# Patient Record
Sex: Male | Born: 1985
Health system: Southern US, Community
[De-identification: ages and names within clinical notes are randomized; demographics above are authoritative.]

---
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
## 2010-08-10 ENCOUNTER — Inpatient Hospital Stay: Payer: Self-pay | Admitting: Unknown Physician Specialty

## 2012-01-31 ENCOUNTER — Ambulatory Visit: Payer: Self-pay | Admitting: Physician Assistant

## 2013-08-08 ENCOUNTER — Emergency Department: Payer: Self-pay | Admitting: Emergency Medicine

## 2013-08-08 LAB — URINALYSIS, COMPLETE
BILIRUBIN, UR: NEGATIVE
BLOOD: NEGATIVE
Bacteria: NONE SEEN
Glucose,UR: NEGATIVE mg/dL (ref 0–75)
Ketone: NEGATIVE
Leukocyte Esterase: NEGATIVE
NITRITE: NEGATIVE
PH: 5 (ref 4.5–8.0)
Protein: NEGATIVE
RBC,UR: 14 /HPF (ref 0–5)
SPECIFIC GRAVITY: 1.023 (ref 1.003–1.030)
Squamous Epithelial: <1

## 2013-08-08 LAB — COMPREHENSIVE METABOLIC PANEL
ALK PHOS: 88 U/L
ANION GAP: 6 — AB (ref 7–16)
Albumin: 4.6 g/dL (ref 3.4–5.0)
BUN: 9 mg/dL (ref 7–18)
Bilirubin,Total: 0.9 mg/dL (ref 0.2–1.0)
CALCIUM: 9.1 mg/dL (ref 8.5–10.1)
Chloride: 107 mmol/L (ref 98–107)
Co2: 27 mmol/L (ref 21–32)
Creatinine: 1.22 mg/dL (ref 0.60–1.30)
EGFR (African American): >60
GLUCOSE: 87 mg/dL (ref 65–99)
OSMOLALITY: 277 (ref 275–301)
Potassium: 3.5 mmol/L (ref 3.5–5.1)
SGOT(AST): 19 U/L (ref 15–37)
SGPT (ALT): 29 U/L (ref 12–78)
Sodium: 140 mmol/L (ref 136–145)
Total Protein: 8.8 g/dL — ABNORMAL HIGH (ref 6.4–8.2)

## 2013-08-08 LAB — CBC
HCT: 46.2 % (ref 40.0–52.0)
HGB: 15.6 g/dL (ref 13.0–18.0)
MCH: 29.8 pg (ref 26.0–34.0)
MCHC: 33.8 g/dL (ref 32.0–36.0)
MCV: 88 fL (ref 80–100)
PLATELETS: 277 10*3/uL (ref 150–440)
RBC: 5.24 10*6/uL (ref 4.40–5.90)
RDW: 12.5 % (ref 11.5–14.5)
WBC: 8 10*3/uL (ref 3.8–10.6)

## 2013-09-12 ENCOUNTER — Emergency Department: Payer: Self-pay | Admitting: Emergency Medicine

## 2013-09-12 LAB — BASIC METABOLIC PANEL
Anion Gap: 10 (ref 7–16)
BUN: 7 mg/dL (ref 7–18)
CALCIUM: 8.8 mg/dL (ref 8.5–10.1)
CO2: 24 mmol/L (ref 21–32)
CREATININE: 1.26 mg/dL (ref 0.60–1.30)
Chloride: 106 mmol/L (ref 98–107)
EGFR (Non-African Amer.): >60
Glucose: 123 mg/dL — ABNORMAL HIGH (ref 65–99)
Osmolality: 279 (ref 275–301)
Potassium: 3.3 mmol/L — ABNORMAL LOW (ref 3.5–5.1)
SODIUM: 140 mmol/L (ref 136–145)

## 2013-09-12 LAB — CBC
HCT: 42.9 % (ref 40.0–52.0)
HGB: 14.4 g/dL (ref 13.0–18.0)
MCH: 29.7 pg (ref 26.0–34.0)
MCHC: 33.5 g/dL (ref 32.0–36.0)
MCV: 89 fL (ref 80–100)
Platelet: 278 10*3/uL (ref 150–440)
RBC: 4.84 10*6/uL (ref 4.40–5.90)
RDW: 13.1 % (ref 11.5–14.5)
WBC: 9.6 10*3/uL (ref 3.8–10.6)

## 2013-09-12 LAB — TROPONIN I

## 2017-11-09 ENCOUNTER — Other Ambulatory Visit: Payer: Self-pay

## 2017-11-09 ENCOUNTER — Emergency Department
Admission: EM | Admit: 2017-11-09 | Discharge: 2017-11-10 | Disposition: A | Discharge status: Home / Self Care | Payer: Self-pay | Attending: Emergency Medicine | Admitting: Emergency Medicine

## 2017-11-09 DIAGNOSIS — F10120 Alcohol abuse with intoxication, uncomplicated: Secondary | ICD-10-CM | POA: Insufficient documentation

## 2017-11-09 DIAGNOSIS — F321 Major depressive disorder, single episode, moderate: Secondary | ICD-10-CM

## 2017-11-09 DIAGNOSIS — F410 Panic disorder [episodic paroxysmal anxiety] without agoraphobia: Secondary | ICD-10-CM

## 2017-11-09 DIAGNOSIS — F1994 Other psychoactive substance use, unspecified with psychoactive substance-induced mood disorder: Secondary | ICD-10-CM | POA: Insufficient documentation

## 2017-11-09 DIAGNOSIS — F329 Major depressive disorder, single episode, unspecified: Secondary | ICD-10-CM | POA: Insufficient documentation

## 2017-11-09 DIAGNOSIS — F1010 Alcohol abuse, uncomplicated: Secondary | ICD-10-CM

## 2017-11-09 LAB — CBC
HEMATOCRIT: 39.4 % — AB (ref 40.0–52.0)
Hemoglobin: 14.2 g/dL (ref 13.0–18.0)
MCH: 31.3 pg (ref 26.0–34.0)
MCHC: 36 g/dL (ref 32.0–36.0)
MCV: 86.8 fL (ref 80.0–100.0)
Platelets: 230 10*3/uL (ref 150–440)
RBC: 4.54 MIL/uL (ref 4.40–5.90)
RDW: 14.1 % (ref 11.5–14.5)
WBC: 7.3 10*3/uL (ref 3.8–10.6)

## 2017-11-09 LAB — COMPREHENSIVE METABOLIC PANEL
ALT: 18 U/L (ref 0–44)
AST: 22 U/L (ref 15–41)
Albumin: 3.7 g/dL (ref 3.5–5.0)
Alkaline Phosphatase: 82 U/L (ref 38–126)
Anion gap: 12 (ref 5–15)
BUN: 7 mg/dL (ref 6–20)
CHLORIDE: 108 mmol/L (ref 98–111)
CO2: 21 mmol/L — ABNORMAL LOW (ref 22–32)
Calcium: 8.3 mg/dL — ABNORMAL LOW (ref 8.9–10.3)
Creatinine, Ser: 0.97 mg/dL (ref 0.61–1.24)
GFR calc Af Amer: >60 mL/min (ref >60)
GLUCOSE: 141 mg/dL — AB (ref 70–99)
Potassium: 3 mmol/L — ABNORMAL LOW (ref 3.5–5.1)
Sodium: 141 mmol/L (ref 135–145)
TOTAL PROTEIN: 7.1 g/dL (ref 6.5–8.1)
Total Bilirubin: 1.2 mg/dL (ref 0.3–1.2)

## 2017-11-09 LAB — ETHANOL: ALCOHOL ETHYL (B): 85 mg/dL — AB (ref <10)

## 2017-11-09 NOTE — ED Notes (Addendum)
Pt. Transferred to BHU from ED to room 2 after screening for contraband. Report to include Situation, Background, Assessment and Recommendations from [facility] Pt. Oriented to unit including Q15 minute rounds as well as the security cameras for their protection. Patient is alert and oriented, warm and dry in no acute distress. Patient denies SI, HI, and AVH. Pt. Encouraged to let me know if needs arise.

## 2017-11-09 NOTE — ED Notes (Signed)
Pt dressed out by this tech and [place], EDT. Pts belongings include: black sneakers, socks, black shorts, underwear and a t shirt. Pt was unable to get tongue ring out. Will attempt to remove again in a few minutes. Pts belongings placed in belongings bag and labeled.  After dressing out pt stated, "I just want to go home."

## 2017-11-09 NOTE — ED Notes (Signed)
Pt extremely drowsy, pt asked to provide urine sample if possible, unable to provide one at this time

## 2017-11-09 NOTE — ED Provider Notes (Signed)
[facility] Emergency Department Provider Note  ____________________________________________   I have reviewed the triage vital signs and the nursing notes.   HISTORY  Chief Complaint Alcohol Intoxication   History limited by and level 5 caveat due to: Intoxication   HPI Edward Peters is a 32 y.o. male who presents to the emergency department today brought in by EMS because of concerns for alcohol intoxication.  Patient states that he drank an unknown amount of liquor today.  It is unclear who called 911.  Patient denies that he himself called 911.  He does state that he has been drinking a lot recently to try to ease the pain of being separated from his children.  He denies any thoughts of self-harm.  He denies any desire to stop drinking.    History reviewed. No pertinent past medical history.  There are no active problems to display for this patient.   History reviewed. No pertinent surgical history.  Prior to Admission medications   Not on File    Allergies Patient has no allergy information on record.  History reviewed. No pertinent family history.  Social History Social History   Tobacco Use  . Smoking status: Never Smoker  . Smokeless tobacco: Current User  Substance Use Topics  . Alcohol use: Yes  . Drug use: Never    Review of Systems Constitutional: No fever/chills Eyes: No visual changes. ENT: No sore throat. Cardiovascular: Denies chest pain. Respiratory: Denies shortness of breath. Gastrointestinal: No abdominal pain.  No nausea, no vomiting.  No diarrhea.   Genitourinary: Negative for dysuria. Musculoskeletal: Negative for back pain. Skin: Negative for rash. Neurological: Negative for headaches, focal weakness or numbness.  ____________________________________________   PHYSICAL EXAM:  VITAL SIGNS: ED Triage Vitals [11/09/17 1514]  Enc Vitals Group     BP 125/79     Pulse Rate 91     Resp 18     Temp    Temp src      SpO2 97 %     Weight 170 lb (77.1 kg)     Height 6' (1.829 m)     Head Circumference      Peak Flow      Pain Score 0   Constitutional: Intoxicated. Eyes: Conjunctivae are normal.  ENT      Head: Normocephalic and atraumatic.      Nose: No congestion/rhinnorhea.      Mouth/Throat: Mucous membranes are moist.      Neck: No stridor. Hematological/Lymphatic/Immunilogical: No cervical lymphadenopathy. Cardiovascular: Normal rate, regular rhythm.  No murmurs, rubs, or gallops.  Respiratory: Normal respiratory effort without tachypnea nor retractions. Breath sounds are clear and equal bilaterally. No wheezes/rales/rhonchi. Gastrointestinal: Soft and non tender. No rebound. No guarding.  Genitourinary: Deferred Musculoskeletal: Normal range of motion in all extremities. No lower extremity edema. Neurologic:  Intoxicated Skin:  Skin is warm, dry and intact. No rash noted. Psychiatric: Depressed, intoxicated ____________________________________________    LABS (pertinent positives/negatives)  Ethanol 85 CBC wbc 7.3, hgb 14.2, plt 230 CMP na 141, k 3.0, glu 141, cr 0.97  ____________________________________________   EKG  I, Phineas Semen, attending physician, personally viewed and interpreted this EKG  EKG Time: 1507 Rate: 97 Rhythm: normal sinus rhythm Axis: normal Intervals: qtc 477 QRS: narrow ST changes: no st elevation Impression: normal ekg  ____________________________________________    RADIOLOGY  None  ____________________________________________   PROCEDURES  Procedures  ____________________________________________   INITIAL IMPRESSION / ASSESSMENT AND PLAN / ED COURSE  Pertinent  labs & imaging results that were available during my care of the patient were reviewed by me and considered in my medical decision making (see chart for details).   Patient presented to the emergency department today because of concerns for alcohol  intoxication.  He states he did drink today.  Discussed with the father who also had some pills that he took.  It appears that these pills were Atarax.  On exam patient was drowsy but arousable.  He was observed in the emergency department and did slowly regain more sobriety.  Patient was placed under IVC by family.  Do have some concerns for suicidal ideation.  Will have patient be seen by psychiatry.   ____________________________________________   FINAL CLINICAL IMPRESSION(S) / ED DIAGNOSES  Alcohol use Overdose Depression  Note: This dictation was prepared with Dragon dictation. Any transcriptional errors that result from this process are unintentional     Phineas Semen, MD 11/09/17 2333

## 2017-11-09 NOTE — ED Notes (Signed)
Father states he believes pt took several hydralazine pills. Father concerned for pt safety, states pt has been drinking non stop for 4 days. Father states he feels pt is depressed and need psych evaluation. Pt denies any suicidal ideations or HI at this time. Pt upset that he is being kept in the hospital, and states he wants to leave. Pt more alert and sitting up in bed at this time. Pt gave verbal permission to give father password to call regarding information about the pt.

## 2017-11-09 NOTE — ED Notes (Signed)
Pts tongue ring added to belongings bag.

## 2017-11-09 NOTE — ED Notes (Signed)
Pt notified of IVC status at this time.

## 2017-11-09 NOTE — ED Triage Notes (Signed)
Pt arrives via ems from home. Ems states pt brought in for alcohol intoxication. Pt alert, but drowsy on arrival, pt unable to states entire birthdate at this time. Only able to state month of birthday. Ems says pt has hx of depression, problems with ex girlfriend who currently has custody of his kids. VS WNL at this time. Pt states he drank 4-5 fifths of liquor, gen/jack/rum. Pt speech slurred but able to follow instructions and communicate clearly. Denies any thoughts of self harm, or harm to others

## 2017-11-09 NOTE — ED Notes (Signed)
Hourly rounding reveals patient sleeping in room. No complaints, stable, in no acute distress. Q15 minute rounds and monitoring via Security Cameras to continue. 

## 2017-11-10 DIAGNOSIS — F1994 Other psychoactive substance use, unspecified with psychoactive substance-induced mood disorder: Secondary | ICD-10-CM

## 2017-11-10 DIAGNOSIS — F410 Panic disorder [episodic paroxysmal anxiety] without agoraphobia: Secondary | ICD-10-CM

## 2017-11-10 DIAGNOSIS — F1010 Alcohol abuse, uncomplicated: Secondary | ICD-10-CM

## 2017-11-10 DIAGNOSIS — F321 Major depressive disorder, single episode, moderate: Secondary | ICD-10-CM

## 2017-11-10 MED ORDER — PAROXETINE HCL 20 MG PO TABS: 20.0000 mg | ORAL_TABLET | Freq: Every day | ORAL | 1 refills | Status: DC

## 2017-11-10 NOTE — ED Notes (Signed)
Patient talking to Dr. Clapacs 

## 2017-11-10 NOTE — ED Notes (Signed)
Hourly rounding reveals patient sleeping in room. No complaints, stable, in no acute distress. Q15 minute rounds and monitoring via Security Cameras to continue. 

## 2017-11-10 NOTE — ED Notes (Signed)
Hourly rounding reveals patient in room. No complaints, stable, in no acute distress. Q15 minute rounds and monitoring via Security Cameras to continue. 

## 2017-11-10 NOTE — ED Notes (Signed)
Patient discharged home with mother, patient received discharge papers and prescription for Paxil. Patient received belongings and verbalized he has received all of his belongings. Patient appropriate and cooperative, Denies SI/HI AVH. Vital signs taken. NAD noted. Denies pain

## 2017-11-10 NOTE — BH Assessment (Signed)
TTS Consult wasn't completed. Patient was seen by Psych MD (Dr. Toni Amend) and discharged prior to writer seeing the patient.

## 2017-11-10 NOTE — ED Provider Notes (Signed)
Commitment was overturned by the tele-psychiatrist.  He is cleared for outpatient follow-up.   Emily Filbert, MD 11/10/17 [phone]
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
## 2020-04-02 ENCOUNTER — Emergency Department
Admission: EM | Admit: 2020-04-02 | Discharge: 2020-04-03 | Disposition: A | Discharge status: Other Acute Inpatient Hospital | Payer: Self-pay | Attending: Emergency Medicine | Admitting: Emergency Medicine

## 2020-04-02 ENCOUNTER — Other Ambulatory Visit: Payer: Self-pay

## 2020-04-02 ENCOUNTER — Emergency Department: Payer: Self-pay

## 2020-04-02 DIAGNOSIS — Z046 Encounter for general psychiatric examination, requested by authority: Secondary | ICD-10-CM | POA: Insufficient documentation

## 2020-04-02 DIAGNOSIS — R42 Dizziness and giddiness: Secondary | ICD-10-CM | POA: Insufficient documentation

## 2020-04-02 DIAGNOSIS — X838XXA Intentional self-harm by other specified means, initial encounter: Secondary | ICD-10-CM | POA: Insufficient documentation

## 2020-04-02 DIAGNOSIS — T71162A Asphyxiation due to hanging, intentional self-harm, initial encounter: Secondary | ICD-10-CM

## 2020-04-02 DIAGNOSIS — T1491XA Suicide attempt, initial encounter: Secondary | ICD-10-CM | POA: Insufficient documentation

## 2020-04-02 DIAGNOSIS — Z20822 Contact with and (suspected) exposure to covid-19: Secondary | ICD-10-CM | POA: Insufficient documentation

## 2020-04-02 DIAGNOSIS — F332 Major depressive disorder, recurrent severe without psychotic features: Secondary | ICD-10-CM

## 2020-04-02 DIAGNOSIS — F321 Major depressive disorder, single episode, moderate: Secondary | ICD-10-CM | POA: Insufficient documentation

## 2020-04-02 DIAGNOSIS — T71164A Asphyxiation due to hanging, undetermined, initial encounter: Secondary | ICD-10-CM

## 2020-04-02 DIAGNOSIS — F17200 Nicotine dependence, unspecified, uncomplicated: Secondary | ICD-10-CM | POA: Insufficient documentation

## 2020-04-02 LAB — COMPREHENSIVE METABOLIC PANEL
ALT: 24 U/L (ref 0–44)
AST: 30 U/L (ref 15–41)
Albumin: 4.7 g/dL (ref 3.5–5.0)
Alkaline Phosphatase: 77 U/L (ref 38–126)
Anion gap: 13 (ref 5–15)
BUN: 12 mg/dL (ref 6–20)
CO2: 25 mmol/L (ref 22–32)
Calcium: 9.4 mg/dL (ref 8.9–10.3)
Chloride: 96 mmol/L — ABNORMAL LOW (ref 98–111)
Creatinine, Ser: 1.24 mg/dL (ref 0.61–1.24)
GFR, Estimated: >60 mL/min (ref >60)
Glucose, Bld: 101 mg/dL — ABNORMAL HIGH (ref 70–99)
Potassium: 3.7 mmol/L (ref 3.5–5.1)
Sodium: 134 mmol/L — ABNORMAL LOW (ref 135–145)
Total Bilirubin: 2.7 mg/dL — ABNORMAL HIGH (ref 0.3–1.2)
Total Protein: 8.2 g/dL — ABNORMAL HIGH (ref 6.5–8.1)

## 2020-04-02 LAB — CBC WITH DIFFERENTIAL/PLATELET
Abs Immature Granulocytes: 0.03 10*3/uL (ref 0.00–0.07)
Basophils Absolute: 0 10*3/uL (ref 0.0–0.1)
Basophils Relative: 0 %
Eosinophils Absolute: 0.2 10*3/uL (ref 0.0–0.5)
Eosinophils Relative: 3 %
HCT: 42.5 % (ref 39.0–52.0)
Hemoglobin: 15.4 g/dL (ref 13.0–17.0)
Immature Granulocytes: 0 %
Lymphocytes Relative: 25 %
Lymphs Abs: 1.9 10*3/uL (ref 0.7–4.0)
MCH: 31.2 pg (ref 26.0–34.0)
MCHC: 36.2 g/dL — ABNORMAL HIGH (ref 30.0–36.0)
MCV: 86.2 fL (ref 80.0–100.0)
Monocytes Absolute: 0.6 10*3/uL (ref 0.1–1.0)
Monocytes Relative: 8 %
Neutro Abs: 5 10*3/uL (ref 1.7–7.7)
Neutrophils Relative %: 64 %
Platelets: 283 10*3/uL (ref 150–400)
RBC: 4.93 MIL/uL (ref 4.22–5.81)
RDW: 11.8 % (ref 11.5–15.5)
WBC: 7.9 10*3/uL (ref 4.0–10.5)
nRBC: 0 % (ref 0.0–0.2)

## 2020-04-02 LAB — APTT: aPTT: 27 seconds (ref 24–36)

## 2020-04-02 LAB — SALICYLATE LEVEL: Salicylate Lvl: <7 mg/dL — ABNORMAL LOW (ref 7.0–30.0)

## 2020-04-02 LAB — LACTIC ACID, PLASMA
Lactic Acid, Venous: 2.1 mmol/L (ref 0.5–1.9)
Lactic Acid, Venous: 1 mmol/L (ref 0.5–1.9)

## 2020-04-02 LAB — RESP PANEL BY RT-PCR (FLU A&B, COVID) ARPGX2
Influenza A by PCR: NEGATIVE
Influenza B by PCR: NEGATIVE
SARS Coronavirus 2 by RT PCR: NEGATIVE

## 2020-04-02 LAB — PROTIME-INR
INR: 1 (ref 0.8–1.2)
Prothrombin Time: 12.8 seconds (ref 11.4–15.2)

## 2020-04-02 LAB — ETHANOL: Alcohol, Ethyl (B): <10 mg/dL (ref <10)

## 2020-04-02 LAB — ACETAMINOPHEN LEVEL: Acetaminophen (Tylenol), Serum: <10 ug/mL — ABNORMAL LOW (ref 10–30)

## 2020-04-02 MED ORDER — SODIUM CHLORIDE 0.9 % IV BOLUS
500.0000 mL | Freq: Once | INTRAVENOUS | Status: AC
  Administered 2020-04-02: 500 mL via INTRAVENOUS

## 2020-04-02 MED ORDER — IOHEXOL 350 MG/ML SOLN
75.0000 mL | Freq: Once | INTRAVENOUS | Status: AC | PRN
  Administered 2020-04-02: 75 mL via INTRAVENOUS

## 2020-04-02 MED ORDER — TETANUS-DIPHTH-ACELL PERTUSSIS 5-2.5-18.5 LF-MCG/0.5 IM SUSY
0.5000 mL | PREFILLED_SYRINGE | Freq: Once | INTRAMUSCULAR | Status: DC
  Filled 2020-04-02: qty 0.5

## 2020-04-02 NOTE — ED Notes (Signed)
Unable to get vitals at this time. Pt sleeping.

## 2020-04-02 NOTE — ED Notes (Signed)
IVC  SEEN  BY  DR  CLAPACS  PENDING  PLACEMENT 

## 2020-04-02 NOTE — BH Assessment (Signed)
Comprehensive Clinical Assessment (CCA) Note  04/02/2020 Delray AltRobert G Peters [id number]  Edward GarreRobert Peters is a 35 year old, Caucasian male who presents to the ER after attempting to end his life. Information provided to the ER was that the patient had hung his self in a tree with the intentions of dying. Patient became upset because he was cut down from the tree and unable to complete the suicide. With this writer the patient was tearful guarded and withdrawn. He stated there was no point in talking to him because he has no reason to live. He reported he have had a lot of loss. Approximately a month ago he and his wife separated, and she took the three children, as well as he lost his job.  During the interview patient was cooperative and made attempts to engage with the Clinical research associatewriter. However, he was adamant about the conversation being pointless because he had no hope or reason to live. Is unclear if this was the patient only suicide attempt. When asked, patient became more tearful, and it was difficult for writer to understand what he was saying   Chief Complaint:  Chief Complaint  Patient presents with  . Suicide Attempt   Visit Diagnosis: Major Depression    CCA Screening, Triage and Referral (STR)  Patient Reported Information How did you hear about us? No data recorded Referral name: EMS  Referral phone number: No data recorded  Whom do you see for routine medical problems? I don't have a doctor  Practice/Facility Name: No data recorded Practice/Facility Phone Number: No data recorded Name of Contact: No data recorded Contact Number: No data recorded Contact Fax Number: No data recorded Prescriber Name: No data recorded Prescriber Address (if known): No data recorded  What Is the Reason for Your Visit/Call Today? Suicide Attempt  How Long Has This Been Causing You Problems? 1-6 months  What Do You Feel Would Help You the Most Today? No data recorded  Have You Recently Been in Any  Inpatient Treatment (Hospital/Detox/Crisis Center/28-Day Program)? No  Name/Location of Program/Hospital:No data recorded How Long Were You There? No data recorded When Were You Discharged? No data recorded  Have You Ever Received Services From [facility] Before? Yes  Who Do You [facility]? Medical Treatment   Have You Recently Had Any Thoughts About Hurting Yourself? Yes  Are You Planning to Commit Suicide/Harm Yourself At This time? Yes   Have you Recently Had Thoughts About Hurting Someone Karolee Ohslse? No  Explanation: No data recorded  Have You Used Any Alcohol or Drugs in the Past 24 Hours? No  How Long Ago Did You Use Drugs or Alcohol? No data recorded What Did You Use and How Much? No data recorded  Do You Currently Have a Therapist/Psychiatrist? No  Name of Therapist/Psychiatrist: No data recorded  Have You Been Recently Discharged From Any Office Practice or Programs? No  Explanation of Discharge From Practice/Program: No data recorded    CCA Screening Triage Referral Assessment Type of Contact: Face-to-Face  Is this Initial or Reassessment? No data recorded Date Telepsych consult ordered in [facility]:  No data recorded Time Telepsych consult ordered in [facility]:  No data recorded  Patient Reported Information Reviewed? Yes  Patient Left Without Being Seen? No data recorded Reason for Not Completing Assessment: No data recorded  Collateral Involvement: n/a   Does Patient Have a Court Appointed Legal Guardian? No data recorded Name and Contact of Legal Guardian: No data recorded If Minor and Not Living with Parent(s), Who  has Custody? n/a  Is CPS involved or ever been involved? Never  Is APS involved or ever been involved? Never   Patient Determined To Be At Risk for Harm To Self or Others Based on Review of Patient Reported Information or Presenting Complaint? No  Method: No data recorded Availability of Means: No data recorded Intent: No data  recorded Notification Required: No data recorded Additional Information for Danger to Others Potential: No data recorded Additional Comments for Danger to Others Potential: No data recorded Are There Guns or Other Weapons in Your Home? No data recorded Types of Guns/Weapons: No data recorded Are These Weapons Safely Secured?                            No data recorded Who Could Verify You Are Able To Have These Secured: No data recorded Do You Have any Outstanding Charges, Pending Court Dates, Parole/Probation? No data recorded Contacted To Inform of Risk of Harm To Self or Others: No data recorded  Location of Assessment: [facility] ED   Does Patient Present under Involuntary Commitment? Yes  IVC Papers Initial File Date: 04/02/2020   [place] of Residence: No data recorded  Patient Currently Receiving the Following Services: Not Receiving Services   Determination of Need: Emergent (2 hours)   Options For Referral: Inpatient Hospitalization     CCA Biopsychosocial Intake/Chief Complaint:  Suicide Attempt  Current Symptoms/Problems: Depression   Patient Reported Schizophrenia/Schizoaffective Diagnosis in Past: No   Strengths: Reports of none  Preferences: He wanted to die  Abilities: He is able to care for himself   Type of Services Patient Feels are Needed: He states none   Initial Clinical Notes/Concerns: Self harm   Mental Health Symptoms Depression:  Change in energy/activity; Fatigue; Hopelessness; Increase/decrease in appetite; Irritability; Tearfulness   Duration of Depressive symptoms: Greater than two weeks   Mania:  None   Anxiety:   Difficulty concentrating   Psychosis:  None   Duration of Psychotic symptoms: No data recorded  Trauma:  None   Obsessions:  None   Compulsions:  None   Inattention:  None   Hyperactivity/Impulsivity:  N/A   Oppositional/Defiant Behaviors:  None   Emotional Irregularity:  None   Other Mood/Personality  Symptoms:  Depression    Mental Status Exam Appearance and self-care  Stature:  Average   Weight:  Average weight   Clothing:  Neat/clean; Age-appropriate   Grooming:  Normal   Cosmetic use:  None   Posture/gait:  Normal   Motor activity:  No data recorded  Sensorium  Attention:  Normal   Concentration:  No data recorded  Orientation:  X5   Recall/memory:  Normal   Affect and Mood  Affect:  Depressed   Mood:  No data recorded  Relating  Eye contact:  None   Facial expression:  Depressed   Attitude toward examiner:  Guarded   Thought and Language  Speech flow: Clear and Coherent   Thought content:  Appropriate to Mood and Circumstances   Preoccupation:  No data recorded  Hallucinations:  None   Organization:  No data recorded  [employer] of Knowledge:  Average   Intelligence:  Average   Abstraction:  Normal   Judgement:  Poor   Reality Testing:  Adequate   Insight:  Poor   Decision Making:  Normal   Social Functioning  Social Maturity:  Impulsive   Social Judgement:  Victimized  Stress  Stressors:  Relationship   Coping Ability:  Normal   Skill Deficits:  None   Supports:  Support needed     Religion: Religion/Spirituality Are You A Religious Person?: No  Leisure/Recreation: Leisure / Recreation Do You Have Hobbies?: No  Exercise/Diet: Exercise/Diet Do You Exercise?: No Have You Gained or Lost A Significant Amount of Weight in the Past Six Months?: No Do You Follow a Special Diet?: No Do You Have Any Trouble Sleeping?: No   CCA Employment/Education Employment/Work Situation: Employment / Work Situation Employment situation: Biomedical scientist job has been impacted by current illness: No What is the longest time patient has a held a job?: Unknown, patient wouldn't say Where was the patient employed at that time?: Recently lost his job Has patient ever been in the [employer]?:  (Unknown, patient wouldn't  say)  Education: Education Is Patient Currently Attending School?: No Last Grade Completed:  (Unknown, patient wouldn't say) Did Garment/textile technologist From [employer]?:  (Unknown, patient wouldn't say) Did You Attend College?:  (Unknown, patient wouldn't say) Did You Attend Graduate School?:  (Unknown, patient wouldn't say) Did You Have Any Special Interests In School?: Unknown, patient wouldn't say Did You Have An Individualized Education Program (IIEP):  (Unknown, patient wouldn't say) Did You Have Any Difficulty At School?:  (Unknown, patient wouldn't say) Patient's Education Has Been Impacted by Current Illness:  (Unknown, patient wouldn't say)   CCA Family/Childhood History Family and Relationship History: Family history Marital status: Separated Separated, when?: Approximately a month ago What types of issues is patient dealing with in the relationship?: Unknown, patient would't say Additional relationship information: None reported Are you sexually active?:  (Unknown) Does patient have children?: Yes How many children?: 3 How is patient's relationship with their children?: Recently separated  Childhood History:  Childhood History Additional childhood history information: Unknown, patient wouldn't say Description of patient's relationship with caregiver when they were a child: Unknown, patient wouldn't say Patient's description of current relationship with people who raised him/her: Unknown, patient wouldn't say How were you disciplined when you got in trouble as a child/adolescent?: Unknown, patient wouldn't say Does patient have siblings?:  (Unknown, patient wouldn't say) Did patient suffer any verbal/emotional/physical/sexual abuse as a child?:  (Unknown, patient wouldn't say) Did patient suffer from severe childhood neglect?:  (Unknown, patient wouldn't say) Has patient ever been sexually abused/assaulted/raped as an adolescent or adult?:  (Unknown, patient wouldn't say) Was  the patient ever a victim of a crime or a disaster?:  (Unknown, patient wouldn't say) Witnessed domestic violence?:  (Unknown, patient wouldn't say) Has patient been affected by domestic violence as an adult?:  (Unknown, patient wouldn't say)  Child/Adolescent Assessment:     CCA Substance Use Alcohol/Drug Use: Alcohol / Drug Use Pain Medications: See PTA Prescriptions: See PTA Over the Counter: See PTA History of alcohol / drug use?:  (Unknown, patient wouldn't say) Longest period of sobriety (when/how long): Unknown, patient wouldn't say Negative Consequences of Use:  (Unknown, patient wouldn't say)                         ASAM's:  Six Dimensions of Multidimensional Assessment  Dimension 1:  Acute Intoxication and/or Withdrawal Potential:      Dimension 2:  Biomedical Conditions and Complications:      Dimension 3:  Emotional, Behavioral, or Cognitive Conditions and Complications:     Dimension 4:  Readiness to Change:     Dimension 5:  Relapse, Continued  use, or Continued Problem Potential:     Dimension 6:  Recovery/Living Environment:     ASAM Severity Score:    ASAM Recommended Level of Treatment:     Substance use Disorder (SUD)    Recommendations for Services/Supports/Treatments:    DSM5 Diagnoses: Patient Active Problem List   Diagnosis Date Noted  . Severe recurrent major depression without psychotic features (HCC) 04/02/2020  . Suicide attempt by hanging (HCC) 04/02/2020  . Alcohol abuse 11/10/2017  . Substance induced mood disorder (HCC) 11/10/2017  . Moderate major depression, single episode (HCC) 11/10/2017  . Panic disorder 11/10/2017    Patient Centered Plan: Patient is on the following Treatment Plan(s):  Depression   Referrals to Alternative Service(s): Referred to Alternative Service(s):   Place:   Date:   Time:    Referred to Alternative Service(s):   Place:   Date:   Time:    Referred to Alternative Service(s):   Place:   Date:    Time:    Referred to Alternative Service(s):   Place:   Date:   Time:     Lilyan Gilford MS, LCAS, [facility], [facility] Therapeutic Triage Specialist 04/02/2020 4:15 PM

## 2020-04-02 NOTE — ED Notes (Signed)
Artis Delay notified of Critical Lactic Acid level 2.1. MD to follow up.

## 2020-04-02 NOTE — ED Notes (Signed)
Hourly rounding completed at this time, patient currently asleep in room. No complaints, stable, and in no acute distress. Q15 minute rounds and monitoring via Security Cameras to continue. 

## 2020-04-02 NOTE — ED Triage Notes (Signed)
Reported via EMS for attempted suicide. Patient attempted to hang himself from a tree. Was cut down immediately after going limp by law enforcement. Patient alert and oriented. C-Collar placed by EMS.

## 2020-04-02 NOTE — ED Notes (Signed)
Pt dressed out into burgundy scrubs. Belongings include: Black boots, blue jeans, black socks, a torn black hoodie, and a belt

## 2020-04-02 NOTE — ED Notes (Signed)
Edward Peters, NT at bedside to provide 1:1 observation of patient. Patient denies needs, complaints, questions or concerns. Lights dimmed for patient comfort.

## 2020-04-02 NOTE — ED Notes (Signed)
Report received from Laura, RN. This RN providing direct care at this time.  

## 2020-04-02 NOTE — ED Notes (Signed)
Unable to give snack at this time. Pt sleeping.

## 2020-04-02 NOTE — ED Notes (Signed)
Pt transferred into ED BHU room 1   Patient assigned to appropriate care area. Patient oriented to unit/care area: Informed that, for his safety, care areas are designed for safety and monitored by security cameras at all times; Visiting hours and phone times explained to patient. Patient verbalizes understanding, and verbal contract for safety obtained.   Assessment completed  He denies pain   

## 2020-04-02 NOTE — ED Notes (Signed)
Report received from Amy, RN including situation, background, assessment and recommendations. Patient sleeping, respirations regular and unlabored. Q15 minute rounds and security camera observation to continue. Will assess patient once awake. 

## 2020-04-02 NOTE — ED Notes (Signed)
Patient made aware of need for urine sample. NT at bedside aware of ordered urine as well.

## 2020-04-02 NOTE — ED Notes (Signed)
Pt is woken up by this nurse due to need for TDAP, pt immediately flails in bed and wines about staff having him do things he does not want to do. Pt refuses shot due to "I don't like needles", pt asked if he understands tetanus and what happens if a person gets tetanus. Pt states yes and is told by nurse anyway which he starts to cry. Pt is angry with this nurse and being in hospital, states that nobody will help him here and he just wants to have his family back. Pt is seen to have markings on neck from rope today. Pt also states to nurse that "I didn't use no metal, I used rope" when speaking about tetanus. Pt has no interest in speaking with nurse and is rude to this nurse. When asked if he has any SI he starts to cry but shakes his head no. Pt continues to express anger with nurse until nurse leaves room. Nurse informs pt that vitals are needed and pt flails his extremities and starts to yell "Y'all are just doing anything y'all want with me here and I just want to leave" and starts to wale crying. This nurse exits due to pt actions. To continue to monitor.

## 2020-04-02 NOTE — Consult Note (Signed)
[facility] Face-to-Face Psychiatry Consult   Reason for Consult: Consult for 35 year old man brought by law enforcement after attempting to kill himself by hanging Referring Physician: Fuller Plan Patient Identification: Edward Peters MRN:  [id number] Principal Diagnosis: Severe recurrent major depression without psychotic features (HCC) Diagnosis:  Principal Problem:   Severe recurrent major depression without psychotic features (HCC) Active Problems:   Suicide attempt by hanging (HCC)   Total Time spent with patient: 1 hour  Subjective:   Edward Peters is a 35 y.o. male patient admitted with "I just cannot take it anymore".  HPI: Brought in under IVC filed by Patent examiner.  Sheriffs were called to his home where they found the patient outdoors near a tree with some kind of cord tied around his neck.  When sheriffs arrived the patient stepped off of what ever he was standing on forcing sheriffs to grab him and lift them up and cut the cord.  Patient suffered some skin abrasions but does not seem to suffer it any worse injury to his neck.  He endorses that this was a suicide attempt.  Patient reports severely depressed mood for a few weeks.  He feels like he has "lost everything".  Apparently his woman with whom he had been living his no longer living with him and has taken the baby from him.  He also continues to be estranged from his older children and their mother.  Has reportedly not slept or eaten well in a week.  Not able to do his work.  Feeling like he has to get rid of all of his animals at home because he cannot work and take care of them at the same time.  Patient denies that he has been drinking or abusing any drugs recently.  Not on any prescription medicine.  Has not been getting any mental health treatment.  Denies any psychotic symptoms.  Past Psychiatric History: Patient has a past history of depression and behavior problems which in the past were often related to alcohol abuse.  Does  not appear to have had prior actual hospitalizations.  Risk to Self:   Risk to Others:   Prior Inpatient Therapy:   Prior Outpatient Therapy:    Past Medical History: History reviewed. No pertinent past medical history. History reviewed. No pertinent surgical history. Family History: History reviewed. No pertinent family history. Family Psychiatric  History: On previous reports he had said that he had multiple members of his family with mental health issues Social History:  Social History   Substance and Sexual Activity  Alcohol Use Yes     Social History   Substance and Sexual Activity  Drug Use Never    Social History   Socioeconomic History  . Marital status: Single    Spouse name: Not on file  . Number of children: Not on file  . Years of education: Not on file  . Highest education level: Not on file  Occupational History  . Not on file  Tobacco Use  . Smoking status: Never Smoker  . Smokeless tobacco: Current User  Vaping Use  . Vaping Use: Never used  Substance and Sexual Activity  . Alcohol use: Yes  . Drug use: Never  . Sexual activity: Yes  Other Topics Concern  . Not on file  Social History Narrative  . Not on file   Social Determinants of Health   Financial Resource Strain: Not on file  Food Insecurity: Not on file  Transportation Needs: Not on file  Physical Activity: Not on file  Stress: Not on file  Social Connections: Not on file   Additional Social History:    Allergies:  No Known Allergies  Labs:  Results for orders placed or performed during the hospital encounter of 04/02/20 (from the past 48 hour(s))  CBC with Differential     Status: Abnormal   Collection Time: 04/02/20 12:12 PM  Result Value Ref Range   WBC 7.9 4.0 - 10.5 K/uL   RBC 4.93 4.22 - 5.81 MIL/uL   Hemoglobin 15.4 13.0 - 17.0 g/dL   HCT 83.3 82.5 - 05.3 %   MCV 86.2 80.0 - 100.0 fL   MCH 31.2 26.0 - 34.0 pg   MCHC 36.2 (H) 30.0 - 36.0 g/dL   RDW 97.6 73.4 - 19.3 %    Platelets 283 150 - 400 K/uL   nRBC 0.0 0.0 - 0.2 %   Neutrophils Relative % 64 %   Neutro Abs 5.0 1.7 - 7.7 K/uL   Lymphocytes Relative 25 %   Lymphs Abs 1.9 0.7 - 4.0 K/uL   Monocytes Relative 8 %   Monocytes Absolute 0.6 0.1 - 1.0 K/uL   Eosinophils Relative 3 %   Eosinophils Absolute 0.2 0.0 - 0.5 K/uL   Basophils Relative 0 %   Basophils Absolute 0.0 0.0 - 0.1 K/uL   Immature Granulocytes 0 %   Abs Immature Granulocytes 0.03 0.00 - 0.07 K/uL    Comment: Performed at [facility], [address] [place], [place] [address]  Comprehensive metabolic panel     Status: Abnormal   Collection Time: 04/02/20 12:12 PM  Result Value Ref Range   Sodium 134 (L) 135 - 145 mmol/L   Potassium 3.7 3.5 - 5.1 mmol/L    Comment: HEMOLYSIS AT THIS LEVEL MAY AFFECT RESULT   Chloride 96 (L) 98 - 111 mmol/L   CO2 25 22 - 32 mmol/L   Glucose, Bld 101 (H) 70 - 99 mg/dL    Comment: Glucose reference range applies only to samples taken after fasting for at least 8 hours.   BUN 12 6 - 20 mg/dL   Creatinine, Ser 0.97 0.61 - 1.24 mg/dL   Calcium 9.4 8.9 - 35.3 mg/dL   Total Protein 8.2 (H) 6.5 - 8.1 g/dL   Albumin 4.7 3.5 - 5.0 g/dL   AST 30 15 - 41 U/L   ALT 24 0 - 44 U/L   Alkaline Phosphatase 77 38 - 126 U/L   Total Bilirubin 2.7 (H) 0.3 - 1.2 mg/dL   GFR, Estimated >29 >92 mL/min    Comment: (NOTE) Calculated using the CKD-EPI Creatinine Equation (2021)    Anion gap 13 5 - 15    Comment: Performed at [facility], [address] [place], [place] [address]  Ethanol     Status: None   Collection Time: 04/02/20 12:12 PM  Result Value Ref Range   Alcohol, Ethyl (B) <10 <10 mg/dL    Comment: (NOTE) Lowest detectable limit for serum alcohol is 10 mg/dL.  For medical purposes only. Performed at [facility], [address] [place], [place] [address]   Protime-INR     Status: None   Collection Time: 04/02/20 12:12 PM  Result Value Ref Range   Prothrombin  Time 12.8 11.4 - 15.2 seconds   INR 1.0 0.8 - 1.2    Comment: (NOTE) INR goal varies based on device and disease states. Performed at [facility], [address] [place],  [place] [address]   APTT     Status: None   Collection Time: 04/02/20 12:12 PM  Result Value Ref Range   aPTT 27 24 - 36 seconds    Comment: Performed at [facility], [address] [place], [place] [address]  Lactic acid, plasma     Status: Abnormal   Collection Time: 04/02/20 12:12 PM  Result Value Ref Range   Lactic Acid, Venous 2.1 (HH) 0.5 - 1.9 mmol/L    Comment: CRITICAL RESULT CALLED TO, READ BACK BY AND VERIFIED WITH LAURA HERNANDEZ ON 04/02/20 AT 1245 QSD Performed at [facility], [address], [place], [place] [address]   Resp Panel by RT-PCR (Flu A&B, Covid) Nasopharyngeal Swab     Status: None   Collection Time: 04/02/20 12:14 PM   Specimen: Nasopharyngeal Swab; Nasopharyngeal(NP) swabs in vial transport medium  Result Value Ref Range   SARS Coronavirus 2 by RT PCR NEGATIVE NEGATIVE    Comment: (NOTE) SARS-CoV-2 target nucleic acids are NOT DETECTED.  The SARS-CoV-2 RNA is generally detectable in upper respiratory specimens during the acute phase of infection. The lowest concentration of SARS-CoV-2 viral copies this assay can detect is 138 copies/mL. A negative result does not preclude SARS-Cov-2 infection and should not be used as the sole basis for treatment or other patient management decisions. A negative result may occur with  improper specimen collection/handling, submission of specimen other than nasopharyngeal swab, presence of viral mutation(s) within the areas targeted by this assay, and inadequate number of viral copies(<138 copies/mL). A negative result must be combined with clinical observations, patient history, and epidemiological information. The expected result is Negative.  Fact Sheet for Patients:   BloggerCourse.com  Fact Sheet for Healthcare Providers:  SeriousBroker.it  This test is no t yet approved or cleared by the [place] FDA and  has been authorized for detection and/or diagnosis of SARS-CoV-2 by FDA under an Emergency Use Authorization (EUA). This EUA will remain  in effect (meaning this test can be used) for the duration of the COVID-19 declaration under Section 564(b)(1) of the Act, 21 U.S.C.section 360bbb-3(b)(1), unless the authorization is terminated  or revoked sooner.       Influenza A by PCR NEGATIVE NEGATIVE   Influenza B by PCR NEGATIVE NEGATIVE    Comment: (NOTE) The Xpert Xpress SARS-CoV-2/FLU/RSV plus assay is intended as an aid in the diagnosis of influenza from Nasopharyngeal swab specimens and should not be used as a sole basis for treatment. Nasal washings and aspirates are unacceptable for Xpert Xpress SARS-CoV-2/FLU/RSV testing.  Fact Sheet for Patients: BloggerCourse.com  Fact Sheet for Healthcare Providers: SeriousBroker.it  This test is not yet approved or cleared by the [place] FDA and has been authorized for detection and/or diagnosis of SARS-CoV-2 by FDA under an Emergency Use Authorization (EUA). This EUA will remain in effect (meaning this test can be used) for the duration of the COVID-19 declaration under Section 564(b)(1) of the Act, 21 U.S.C. section 360bbb-3(b)(1), unless the authorization is terminated or revoked.  Performed at [facility], [address] [place], [place] [address]   Acetaminophen level     Status: Abnormal   Collection Time: 04/02/20  2:00 PM  Result Value Ref Range   Acetaminophen (Tylenol), Serum <10 (L) 10 - 30 ug/mL    Comment: (NOTE) Therapeutic concentrations vary significantly. A range of 10-30 ug/mL  may be an effective concentration for many patients. However, some  are best  treated at concentrations outside  of this range. Acetaminophen concentrations >150 ug/mL at 4 hours after ingestion  and >50 ug/mL at 12 hours after ingestion are often associated with  toxic reactions.  Performed at [facility], [address] [place], [place] [address]   Salicylate level     Status: Abnormal   Collection Time: 04/02/20  2:00 PM  Result Value Ref Range   Salicylate Lvl <7.0 (L) 7.0 - 30.0 mg/dL    Comment: Performed at [facility], [address] [place], [place] [address]  Lactic acid, plasma     Status: None   Collection Time: 04/02/20  2:15 PM  Result Value Ref Range   Lactic Acid, Venous 1.0 0.5 - 1.9 mmol/L    Comment: Performed at [facility], [address] [place], [place] [address]    No current facility-administered medications for this encounter.   Current Outpatient Medications  Medication Sig Dispense Refill  . PARoxetine (PAXIL) 20 MG tablet Take 1 tablet (20 mg total) by mouth daily. 30 tablet 1    Musculoskeletal: Strength & Muscle Tone: decreased Gait & Station: Did not attempt to stand the patient up Patient leans: N/A  Psychiatric Specialty Exam: Physical Exam Vitals and nursing note reviewed.  Constitutional:      Appearance: He is well-developed and well-nourished.  HENT:     Head: Normocephalic and atraumatic.  Eyes:     Conjunctiva/sclera: Conjunctivae normal.     Pupils: Pupils are equal, round, and reactive to light.  Cardiovascular:     Heart sounds: Normal heart sounds.  Pulmonary:     Effort: Pulmonary effort is normal.  Abdominal:     Palpations: Abdomen is soft.  Musculoskeletal:        General: Normal range of motion.     Cervical back: Normal range of motion.  Skin:    General: Skin is warm and dry.       Neurological:     Mental Status: He is alert.  Psychiatric:        Attention and Perception: Attention normal.        Mood and Affect: Mood is depressed. Affect is  tearful.        Speech: Speech is delayed.        Behavior: Behavior is agitated. Behavior is not aggressive.        Thought Content: Thought content includes suicidal ideation. Thought content includes suicidal plan.        Cognition and Memory: Cognition is impaired.        Judgment: Judgment is impulsive.     Review of Systems  Constitutional: Negative.   HENT: Negative.   Eyes: Negative.   Respiratory: Negative.   Cardiovascular: Negative.   Gastrointestinal: Negative.   Musculoskeletal: Negative.   Skin: Negative.   Neurological: Negative.   Psychiatric/Behavioral: Positive for dysphoric mood, sleep disturbance and suicidal ideas.    Blood pressure 102/72, pulse 65, temperature 98.3 F (36.8 C), temperature source Oral, resp. rate 12, height 6' (1.829 m), weight 77.6 kg, SpO2 100 %.Body mass index is 23.2 kg/m.  General Appearance: Disheveled  Eye Contact:  Fair  Speech:  Garbled  Volume:  Decreased  Mood:  Depressed  Affect:  Depressed and Tearful  Thought Process:  Coherent  Orientation:  Full (Time, Place, and Person)  Thought Content:  Rumination and Tangential  Suicidal Thoughts:  Yes.  with intent/plan  Homicidal Thoughts:  No  Memory:  Immediate;   Fair Recent;   Fair Remote;  Fair  Judgement:  Impaired  Insight:  Shallow  Psychomotor Activity:  Decreased  Concentration:  Concentration: Fair  Recall:  [employer] of Knowledge:  Fair  Language:  Fair  Akathisia:  No  Handed:  Right  AIMS (if indicated):     Assets:  Desire for Improvement Housing  ADL's:  Impaired  Cognition:  Impaired,  Mild  Sleep:        Treatment Plan Summary: Plan 35 year old man attempted to kill himself by hanging.  Endorses multiple symptoms of major depression.  Tearful and distraught currently.  Feeling hopeless.  Once medically cleared patient meets criteria for admission to psychiatric ward.  Continue IVC.  Continue close monitoring with a sitter until he can be moved  into the psychiatry area of the emergency room.  Case reviewed with ER physician and TTS.  Patient denies that he has been drinking recently does not have alcohol or drugs in his system so it does not appear that he needs to have withdrawal orders written.  Disposition: Recommend psychiatric Inpatient admission when medically cleared. Supportive therapy provided about ongoing stressors.  Mordecai Rasmussen, MD 04/02/2020 3:45 PM

## 2020-04-02 NOTE — BH Assessment (Signed)
Referral information for Psychiatric Hospitalization faxed to;   . [facility] ([phone]-or- [phone]),   . [facility] ([phone]---[phone]---[phone]),  . [facility] ([phone], [phone], [phone] or [phone]),   . [facility] ([phone] or [phone])  . [facility] ([phone]),   . [facility] ([phone] -or- [phone]),   . [facility] ([phone]). 

## 2020-04-02 NOTE — ED Provider Notes (Signed)
[facility] Emergency Department Provider Note  ____________________________________________   Event Date/Time   First MD Initiated Contact with Patient 04/02/20 1210     (approximate)  I have reviewed the triage vital signs and the nursing notes.   HISTORY  Chief Complaint Suicide Attempt    HPI Edward Peters is a 35 y.o. male with alcohol abuse, depression who comes in for suicidal attempt.  Patient was sending pictures to his wife stating that he was going to kill himself.  The wife called the police.  When the police arrived patient was hanging by a rope.  Patient had been hanging for a few seconds.  Per  patient he had positive LOC.  When I asked patient what happened today he said "please just let me go" patient unable to give any further HPI due to psychiatric illness.  However he did deny any alcohol drug or over-the-counter medications that he took.          History reviewed. No pertinent past medical history.  Patient Active Problem List   Diagnosis Date Noted  . Alcohol abuse 11/10/2017  . Substance induced mood disorder (HCC) 11/10/2017  . Moderate major depression, single episode (HCC) 11/10/2017  . Panic disorder 11/10/2017    History reviewed. No pertinent surgical history.  Prior to Admission medications   Medication Sig Start Date End Date Taking? Authorizing Provider  PARoxetine (PAXIL) 20 MG tablet Take 1 tablet (20 mg total) by mouth daily. 11/10/17 01/09/18  Clapacs, Jackquline DenmarkJohn T, MD    Allergies Patient has no known allergies.  History reviewed. No pertinent family history.  Social History Social History   Tobacco Use  . Smoking status: Never Smoker  . Smokeless tobacco: Current User  Vaping Use  . Vaping Use: Never used  Substance Use Topics  . Alcohol use: Yes  . Drug use: Never      Review of Systems Constitutional: No fever/chills Eyes: No visual changes. ENT: No sore throat.  Ligation marks around his  neck Cardiovascular: Denies chest pain. Respiratory: Denies shortness of breath. Gastrointestinal: No abdominal pain.  No nausea, no vomiting.  No diarrhea.  No constipation. Genitourinary: Negative for dysuria. Musculoskeletal: Negative for back pain. Skin: Negative for rash. Neurological: Negative for headaches, focal weakness or numbness. All other ROS negative ____________________________________________   PHYSICAL EXAM:  VITAL SIGNS: ED Triage Vitals  Enc Vitals Group     BP 04/02/20 1205 99/70     Pulse Rate 04/02/20 1205 95     Resp 04/02/20 1205 18     Temp 04/02/20 1205 98.3 F (36.8 C)     Temp Source 04/02/20 1205 Oral     SpO2 04/02/20 1205 100 %     Weight 04/02/20 1206 171 lb 1.2 oz (77.6 kg)     Height 04/02/20 1206 6' (1.829 m)     Head Circumference --      Peak Flow --      Pain Score 04/02/20 1206 0     Pain Loc --      Pain Edu? --      Excl. in GC? --     Constitutional: Alert and oriented. Well appearing and in no acute distress. Eyes: Conjunctivae are normal. No swelling around eyes.  Pupils equal and reactive.  Extraocular movements are intact Head: Atraumatic. Nose: Redness around his neck.  C-collar in place Mouth/Throat: Mucous membranes are moist.   Neck: No stridor. Trachea Midline. FROM Cardiovascular: Normal rate, no swelling  noted Respiratory: No increased wob, no stridor Gastrointestinal: Soft and nontender. No distention. No abdominal bruits.  Musculoskeletal: No lower extremity tenderness nor edema.  No joint effusions. Neurologic: Cranial nerves II to XII are intact.  Equal strength in arms and legs Skin:  Skin is warm, dry and intact. No rash noted. Psychiatric: Patient is crying.  Positive SI, positive suicide attempt GU: Deferred   ____________________________________________   LABS (all labs ordered are listed, but only abnormal results are displayed)  Labs Reviewed  CBC WITH DIFFERENTIAL/PLATELET - Abnormal; Notable for  the following components:      Result Value   MCHC 36.2 (*)    All other components within normal limits  RESP PANEL BY RT-PCR (FLU A&B, COVID) ARPGX2  COMPREHENSIVE METABOLIC PANEL  ACETAMINOPHEN LEVEL  SALICYLATE LEVEL  URINE DRUG SCREEN, QUALITATIVE ([facility] ONLY)  ETHANOL  PROTIME-INR  APTT  LACTIC ACID, PLASMA  LACTIC ACID, PLASMA  TYPE AND SCREEN   ____________________________________________   ED ECG REPORT I, Concha Se, the attending physician, personally viewed and interpreted this ECG.  Normal sinus rate of 83, no ST elevation, no T wave inversions, normal intervals ____________________________________________  RADIOLOGY Vela Prose, personally viewed and evaluated these images (plain radiographs) as part of my medical decision making, as well as reviewing the written report by the radiologist.  ED MD interpretation: No pneumonia  Official radiology report(s): CT Angio Head W or Wo Contrast  Result Date: 04/02/2020 CLINICAL DATA:  Dizziness, nonspecific.  Attempted hanging. EXAM: CT ANGIOGRAPHY HEAD AND NECK TECHNIQUE: Multidetector CT imaging of the head and neck was performed using the standard protocol during bolus administration of intravenous contrast. Multiplanar CT image reconstructions and MIPs were obtained to evaluate the vascular anatomy. Carotid stenosis measurements (when applicable) are obtained utilizing NASCET criteria, using the distal internal carotid diameter as the denominator. CONTRAST:  62mL OMNIPAQUE IOHEXOL 350 MG/ML SOLN COMPARISON:  Head CT 05/25/2005. FINDINGS: CT HEAD FINDINGS Brain: Cerebral volume is normal. There is no acute intracranial hemorrhage. No demarcated cortical infarct. No extra-axial fluid collection. No evidence of intracranial mass. No midline shift. Vascular: No hyperdense vessel. Skull: Normal. Negative for fracture or focal lesion. Sinuses: Mild bilateral ethmoid and maxillary sinus mucosal thickening. Moderate-sized right  maxillary sinus mucous retention cyst. Orbits: No mass or acute finding. Review of the MIP images confirms the above findings CTA NECK FINDINGS Aortic arch: Standard aortic branching. Minimal soft and calcified plaque within the distal innominate artery/proximal right subclavian artery. No hemodynamically significant innominate or proximal subclavian artery stenosis. Right carotid system: CCA and ICA smooth and patent within the neck without stenosis. Left carotid system: CCA and ICA smooth and patent within the neck without stenosis. Vertebral arteries: Right vertebral artery dominant. Vertebral artery smooth and patent within the neck without stenosis. Skeleton: Please refer to separately reported CT of the cervical spine for cervical spine findings. Elsewhere, no acute bony abnormality is identified. Other neck: No neck mass or cervical lymphadenopathy. No significant neck hematoma. Upper chest: No consolidation within the imaged lung apices. Review of the MIP images confirms the above findings CTA HEAD FINDINGS Anterior circulation: The intracranial internal carotid arteries are patent. The M1 middle cerebral arteries are patent. No M2 proximal branch occlusion or high-grade proximal stenosis is identified. The anterior cerebral arteries are patent. No intracranial aneurysm is identified. Posterior circulation: The intracranial left vertebral artery is developmentally diminutive, but patent. The dominant intracranial right vertebral artery is patent without stenosis. The basilar artery is  patent. The posterior cerebral arteries are patent. A sizable right posterior communicating artery is present. Suspected small left posterior communicating artery. Venous sinuses: Within the limitations of contrast timing, no convincing thrombus. Anatomic variants: None significant Review of the MIP images confirms the above findings IMPRESSION: CT head: 1. No evidence of acute intracranial abnormality. 2. Paranasal sinus  disease as described. CTA neck: The common carotid, internal carotid and vertebral arteries are patent within the neck without stenosis. No evidence of traumatic vascular injury. CTA head: No intracranial large vessel occlusion or proximal high-grade dural stenosis. Electronically Signed   By: Jackey Loge DO   On: 04/02/2020 13:47   CT Angio Neck W and/or Wo Contrast  Result Date: 04/02/2020 CLINICAL DATA:  Dizziness, nonspecific.  Attempted hanging. EXAM: CT ANGIOGRAPHY HEAD AND NECK TECHNIQUE: Multidetector CT imaging of the head and neck was performed using the standard protocol during bolus administration of intravenous contrast. Multiplanar CT image reconstructions and MIPs were obtained to evaluate the vascular anatomy. Carotid stenosis measurements (when applicable) are obtained utilizing NASCET criteria, using the distal internal carotid diameter as the denominator. CONTRAST:  75mL OMNIPAQUE IOHEXOL 350 MG/ML SOLN COMPARISON:  Head CT 05/25/2005. FINDINGS: CT HEAD FINDINGS Brain: Cerebral volume is normal. There is no acute intracranial hemorrhage. No demarcated cortical infarct. No extra-axial fluid collection. No evidence of intracranial mass. No midline shift. Vascular: No hyperdense vessel. Skull: Normal. Negative for fracture or focal lesion. Sinuses: Mild bilateral ethmoid and maxillary sinus mucosal thickening. Moderate-sized right maxillary sinus mucous retention cyst. Orbits: No mass or acute finding. Review of the MIP images confirms the above findings CTA NECK FINDINGS Aortic arch: Standard aortic branching. Minimal soft and calcified plaque within the distal innominate artery/proximal right subclavian artery. No hemodynamically significant innominate or proximal subclavian artery stenosis. Right carotid system: CCA and ICA smooth and patent within the neck without stenosis. Left carotid system: CCA and ICA smooth and patent within the neck without stenosis. Vertebral arteries: Right  vertebral artery dominant. Vertebral artery smooth and patent within the neck without stenosis. Skeleton: Please refer to separately reported CT of the cervical spine for cervical spine findings. Elsewhere, no acute bony abnormality is identified. Other neck: No neck mass or cervical lymphadenopathy. No significant neck hematoma. Upper chest: No consolidation within the imaged lung apices. Review of the MIP images confirms the above findings CTA HEAD FINDINGS Anterior circulation: The intracranial internal carotid arteries are patent. The M1 middle cerebral arteries are patent. No M2 proximal branch occlusion or high-grade proximal stenosis is identified. The anterior cerebral arteries are patent. No intracranial aneurysm is identified. Posterior circulation: The intracranial left vertebral artery is developmentally diminutive, but patent. The dominant intracranial right vertebral artery is patent without stenosis. The basilar artery is patent. The posterior cerebral arteries are patent. A sizable right posterior communicating artery is present. Suspected small left posterior communicating artery. Venous sinuses: Within the limitations of contrast timing, no convincing thrombus. Anatomic variants: None significant Review of the MIP images confirms the above findings IMPRESSION: CT head: 1. No evidence of acute intracranial abnormality. 2. Paranasal sinus disease as described. CTA neck: The common carotid, internal carotid and vertebral arteries are patent within the neck without stenosis. No evidence of traumatic vascular injury. CTA head: No intracranial large vessel occlusion or proximal high-grade dural stenosis. Electronically Signed   By: Jackey Loge DO   On: 04/02/2020 13:47   CT Cervical Spine Wo Contrast  Result Date: 04/02/2020 CLINICAL DATA:  Neck trauma, impaired range  of motion. Additional history provided: Attempted hanging. EXAM: CT CERVICAL SPINE WITHOUT CONTRAST TECHNIQUE: Multidetector CT  imaging of the cervical spine was performed without intravenous contrast. Multiplanar CT image reconstructions were also generated. COMPARISON:  Concurrently performed CTA neck 04/02/2020. FINDINGS: Alignment: The expected cervical lordosis. No significant spondylolisthesis. Skull base and vertebrae: The basion-dental and atlanto-dental intervals are maintained.No evidence of acute fracture to the cervical spine. Soft tissues and spinal canal: No prevertebral fluid or swelling. No visible canal hematoma. Disc levels: Multilevel shallow disc bulges. No more than mild relative spinal canal narrowing is appreciable. No significant bony neural foraminal narrowing. Upper chest: No consolidation within the imaged lung apices. No visible pneumothorax. IMPRESSION: No evidence of acute fracture to the cervical spine. Nonspecific straightening of the expected cervical lordosis. Mild cervical spondylosis. Electronically Signed   By: Jackey Loge DO   On: 04/02/2020 13:52   DG Chest Portable 1 View  Result Date: 04/02/2020 CLINICAL DATA:  Hanging EXAM: PORTABLE CHEST 1 VIEW COMPARISON:  None. FINDINGS: The heart size and mediastinal contours are within normal limits. Both lungs are clear. No pleural effusion or pneumothorax. The visualized skeletal structures are unremarkable. IMPRESSION: No acute process in the chest. Electronically Signed   By: Guadlupe Spanish M.D.   On: 04/02/2020 12:49    ____________________________________________   PROCEDURES  Procedure(s) performed (including Critical Care):  Procedures   ____________________________________________   INITIAL IMPRESSION / ASSESSMENT AND PLAN / ED COURSE  Edward Peters was evaluated in Emergency Department on 04/02/2020 for the symptoms described in the history of present illness. He was evaluated in the context of the global COVID-19 pandemic, which necessitated consideration that the patient might be at risk for infection with the SARS-CoV-2  virus that causes COVID-19. Institutional protocols and algorithms that pertain to the evaluation of patients at risk for COVID-19 are in a state of rapid change based on information released by regulatory bodies including the CDC and federal and state organizations. These policies and algorithms were followed during the patient's care in the ED.    Patient with depression who comes in with hanging himself.  Will get CTA to evaluate for dissection, CT head evaluate for intercranial hemorrhage as well as CT neck to evaluate for cervical fracture.  Will get EKG given the loss of consciousness and will get labs and Tylenol, aspirin level in case of overdose although he denies.  Patient was immediately IVC and one-on-one sitter was placed into the room for his protection due to him being high risk for attempting suicide again   D/d includes but is not limited to psychiatric disease, behavioral/personality disorder, inadequate socioeconomic support, medical.  Initial lactate slightly elevated 2.1.  Patient got 1 L of fluid.  Lactate is normalizing.  No evidence of sepsis.  CT scans were negative and labs were otherwise reassuring.  At this point    Given this, pt medically cleared, to be dispositioned per Psych.    The patient has been placed in psychiatric observation due to the need to provide a safe environment for the patient while obtaining psychiatric consultation and evaluation, as well as ongoing medical and medication management to treat the patient's condition.  The patient has been placed under full IVC at this time.    ____________________________________________   FINAL CLINICAL IMPRESSION(S) / ED DIAGNOSES   Final diagnoses:  Suicide attempt (HCC)  Hanging, initial encounter      MEDICATIONS GIVEN DURING THIS VISIT:  Medications  sodium chloride 0.9 % bolus  500 mL (0 mLs Intravenous Stopped 04/02/20 1300)  iohexol (OMNIPAQUE) 350 MG/ML injection 75 mL (75 mLs Intravenous  Contrast Given 04/02/20 1304)     ED Discharge Orders    None       Note:  This document was prepared using Dragon voice recognition software and may include unintentional dictation errors.   Concha Se, MD 04/02/20 [phone]

## 2020-04-02 NOTE — ED Notes (Signed)
Type and Screen Cancelled per MD request. C-Collar removed, CT negative.

## 2020-04-02 NOTE — ED Notes (Signed)
Patient very emotional, crying. RN to remain at bedside to monitor while waiting for sitter.

## 2020-04-02 NOTE — ED Notes (Signed)
Patient sleeping. Sitter remains at bedside. Patient in no acute distress.

## 2020-04-02 NOTE — ED Notes (Signed)
EKG shown to Dr. Fuller Plan at this time.

## 2020-04-03 ENCOUNTER — Inpatient Hospital Stay
Admission: AD | Admit: 2020-04-03 | Discharge: 2020-04-08 | DRG: 885 | Disposition: A | Discharge status: Home / Self Care | Payer: 59 | Source: Intra-hospital | Attending: Behavioral Health | Admitting: Behavioral Health

## 2020-04-03 DIAGNOSIS — G4700 Insomnia, unspecified: Secondary | ICD-10-CM | POA: Diagnosis present

## 2020-04-03 DIAGNOSIS — F1010 Alcohol abuse, uncomplicated: Secondary | ICD-10-CM | POA: Diagnosis present

## 2020-04-03 DIAGNOSIS — T71162D Asphyxiation due to hanging, intentional self-harm, subsequent encounter: Secondary | ICD-10-CM | POA: Diagnosis not present

## 2020-04-03 DIAGNOSIS — F332 Major depressive disorder, recurrent severe without psychotic features: Secondary | ICD-10-CM | POA: Diagnosis present

## 2020-04-03 DIAGNOSIS — F410 Panic disorder [episodic paroxysmal anxiety] without agoraphobia: Secondary | ICD-10-CM | POA: Diagnosis present

## 2020-04-03 DIAGNOSIS — T71162A Asphyxiation due to hanging, intentional self-harm, initial encounter: Secondary | ICD-10-CM | POA: Diagnosis present

## 2020-04-03 DIAGNOSIS — Z9151 Personal history of suicidal behavior: Secondary | ICD-10-CM | POA: Diagnosis not present

## 2020-04-03 DIAGNOSIS — F1510 Other stimulant abuse, uncomplicated: Secondary | ICD-10-CM | POA: Diagnosis present

## 2020-04-03 LAB — URINE DRUG SCREEN, QUALITATIVE (ARMC ONLY)
Amphetamines, Ur Screen: POSITIVE — AB
Barbiturates, Ur Screen: NOT DETECTED
Benzodiazepine, Ur Scrn: NOT DETECTED
Cannabinoid 50 Ng, Ur ~~LOC~~: NOT DETECTED
Cocaine Metabolite,Ur ~~LOC~~: NOT DETECTED
MDMA (Ecstasy)Ur Screen: NOT DETECTED
Methadone Scn, Ur: NOT DETECTED
Opiate, Ur Screen: NOT DETECTED
Phencyclidine (PCP) Ur S: NOT DETECTED
Tricyclic, Ur Screen: NOT DETECTED

## 2020-04-03 MED ORDER — TRAZODONE HCL 50 MG PO TABS
50.0000 mg | ORAL_TABLET | Freq: Every evening | ORAL | Status: DC | PRN
  Filled 2020-04-03: qty 1

## 2020-04-03 MED ORDER — PAROXETINE HCL 20 MG PO TABS
20.0000 mg | ORAL_TABLET | Freq: Every day | ORAL | Status: DC
  Administered 2020-04-04 – 2020-04-08 (×5): 20 mg via ORAL
  Filled 2020-04-03 (×6): qty 1

## 2020-04-03 MED ORDER — TRAZODONE HCL 100 MG PO TABS
100.0000 mg | ORAL_TABLET | Freq: Every evening | ORAL | Status: DC | PRN
  Administered 2020-04-03 – 2020-04-07 (×2): 100 mg via ORAL
  Filled 2020-04-03 (×2): qty 1

## 2020-04-03 MED ORDER — ACETAMINOPHEN 325 MG PO TABS: 650.0000 mg | ORAL_TABLET | Freq: Four times a day (QID) | ORAL | Status: DC | PRN

## 2020-04-03 MED ORDER — MAGNESIUM HYDROXIDE 400 MG/5ML PO SUSP
30.0000 mL | Freq: Every day | ORAL | Status: DC | PRN
Start: 2020-04-03 — End: 2020-04-08

## 2020-04-03 MED ORDER — ALUM & MAG HYDROXIDE-SIMETH 200-200-20 MG/5ML PO SUSP: 30.0000 mL | ORAL | Status: DC | PRN

## 2020-04-03 NOTE — BH Assessment (Signed)
Patient is to be admitted to [facility] by Dr. Toni Amend.  Attending Physician will be Dr. Neale Burly.   Patient has been assigned to room 301, by [facility] Charge Nurse Megan.   ER staff is aware of the admission:  Nitchia, ER Secretary    Dr. Scotty Court, ER MD   Lattie Corns,  Patient's Nurse   Tho, Patient Access.

## 2020-04-03 NOTE — ED Notes (Signed)
Pt denies need for snack but provided with 2 sprites

## 2020-04-03 NOTE — ED Notes (Signed)
Hourly rounding completed at this time, patient currently asleep in room. No complaints, stable, and in no acute distress. Q15 minute rounds and monitoring via Security Cameras to continue. 

## 2020-04-03 NOTE — ED Notes (Signed)
Pt is uninterested in conversations with staff, withdrawn. Shakes head no when asked if he takes medications at home. Pt not talking but only a few words and using head movements for a lot of communication.

## 2020-04-03 NOTE — ED Notes (Signed)
This nurse asks pt to provide urine sample that is ordered. Pt then becomes agitated with this nurse throwing his arms up in frustration and raising voice Pt states that "y'all" aren't helping me, that staff does not care, and that he just needs to die. Pt not interested in discussion with nurse, does provide urine and then storms back to room

## 2020-04-03 NOTE — ED Notes (Signed)
Report received from Jeannette, RN including  Situation, Background, Assessment, and Recommendations. Patient alert and oriented, warm and dry, in no acute distress. Patient denies SI, HI, AVH and pain. Patient made aware of Q15 minute rounds and security cameras for their safety. Patient instructed to come to this nurse with needs or concerns. 

## 2020-04-03 NOTE — ED Notes (Signed)
Hourly rounding completed at this time, patient currently awake in room. No complaints, stable, and in no acute distress. Q15 minute rounds and monitoring via Security Cameras to continue. 

## 2020-04-03 NOTE — ED Notes (Signed)
IVC/Pending Placement 

## 2020-04-03 NOTE — Tx Team (Signed)
Initial Treatment Plan 04/03/2020 10:35 PM [name] HTX:[id number]    PATIENT STRESSORS: Loss of current relationship Marital or family conflict   PATIENT STRENGTHS: Average or above average intelligence Capable of independent living   PATIENT IDENTIFIED PROBLEMS: Major Depressive Disorder  Suicidal ideation  Lost his current girlfriend and estranged from his other kids                 DISCHARGE CRITERIA:  Improved stabilization in mood, thinking, and/or behavior  PRELIMINARY DISCHARGE PLAN: Outpatient therapy  PATIENT/FAMILY INVOLVEMENT: This treatment plan has been presented to and reviewed with the patient, Edward Peters, and/or family member, .  The patient and family have been given the opportunity to ask questions and make suggestions.  Shelia Media, RN 04/03/2020, 10:35 PM

## 2020-04-04 ENCOUNTER — Other Ambulatory Visit: Payer: Self-pay

## 2020-04-04 DIAGNOSIS — F332 Major depressive disorder, recurrent severe without psychotic features: Principal | ICD-10-CM

## 2020-04-04 MED ORDER — HYDROXYZINE HCL 50 MG PO TABS
50.0000 mg | ORAL_TABLET | Freq: Three times a day (TID) | ORAL | Status: DC | PRN
  Administered 2020-04-05 – 2020-04-08 (×5): 50 mg via ORAL
  Filled 2020-04-04 (×5): qty 1

## 2020-04-04 MED ORDER — ARIPIPRAZOLE 5 MG PO TABS
5.0000 mg | ORAL_TABLET | Freq: Every day | ORAL | Status: DC
  Administered 2020-04-04 – 2020-04-08 (×5): 5 mg via ORAL
  Filled 2020-04-04 (×5): qty 1

## 2020-04-04 NOTE — Progress Notes (Signed)
Recreation Therapy Notes  INPATIENT RECREATION THERAPY ASSESSMENT  Patient Details Name: Edward Peters MRN: [id number] DOB: [birth year] Today's Date: 04/04/2020       Information Obtained From: Patient  Able to Participate in Assessment/Interview: Yes  Patient Presentation: Responsive,Resistant,Withdrawn  Reason for Admission (Per Patient): Active Symptoms,Suicidal Ideation,Suicide Attempt  Patient Stressors: Family  Coping Skills:   Talk  Leisure Interests (2+):  Social - Family  Frequency of Recreation/Participation: Weekly  Awareness of Community Resources:  Yes  Community Resources:     Current Use:    If no, Barriers?:    Expressed Interest in [employer] Information: No  [place]idence[place]  Patient Main Form of Transportation: Set designer  Patient Strengths:  N/A  Patient Identified Areas of Improvement:  N/A  Patient Goal for Hospitalization:  To go home  Current SI (including self-harm):  No  Current HI:  No  Current AVH: No  Staff Intervention Plan: Group Attendance,Collaborate with Interdisciplinary Treatment Team  Consent to Intern Participation: N/A  Edward Peters 04/04/2020, 2:36 PM
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
## 2020-04-04 NOTE — Tx Team (Addendum)
Interdisciplinary Treatment and Diagnostic Plan Update  04/04/2020 Time of Session: 9:00AM Edward Peters MRN: [id number]  Principal Diagnosis: <principal problem not specified>  Secondary Diagnoses: Active Problems:   Major depressive disorder, recurrent episode, severe (HCC)   Current Medications:  Current Facility-Administered Medications  Medication Dose Route Frequency Provider Last Rate Last Admin   acetaminophen (TYLENOL) tablet 650 mg  650 mg Oral Q6H PRN Caroline Sauger, NP       alum & mag hydroxide-simeth (MAALOX/MYLANTA) 200-200-20 MG/5ML suspension 30 mL  30 mL Oral Q4H PRN Caroline Sauger, NP       magnesium hydroxide (MILK OF MAGNESIA) suspension 30 mL  30 mL Oral Daily PRN Caroline Sauger, NP       PARoxetine (PAXIL) tablet 20 mg  20 mg Oral Daily Caroline Sauger, NP   20 mg at 04/04/20 4098   traZODone (DESYREL) tablet 100 mg  100 mg Oral QHS PRN Caroline Sauger, NP   100 mg at 04/03/20 2321   PTA Medications: Medications Prior to Admission  Medication Sig Dispense Refill Last Dose   PARoxetine (PAXIL) 20 MG tablet Take 1 tablet (20 mg total) by mouth daily. 30 tablet 1     Patient Stressors: Loss of current relationship Marital or family conflict  Patient Strengths: Average or above average intelligence Capable of independent living  Treatment Modalities: Medication Management, Group therapy, Case management,  1 to 1 session with clinician, Psychoeducation, Recreational therapy.   Physician Treatment Plan for Primary Diagnosis: <principal problem not specified> Long Term Goal(s):     Short Term Goals:    Medication Management: Evaluate patient's response, side effects, and tolerance of medication regimen.  Therapeutic Interventions: 1 to 1 sessions, Unit Group sessions and Medication administration.  Evaluation of Outcomes: Not Met  Physician Treatment Plan for Secondary Diagnosis: Active Problems:   Major depressive  disorder, recurrent episode, severe ([place])  Long Term Goal(s):     Short Term Goals:       Medication Management: Evaluate patient's response, side effects, and tolerance of medication regimen.  Therapeutic Interventions: 1 to 1 sessions, Unit Group sessions and Medication administration.  Evaluation of Outcomes: Not Met   RN Treatment Plan for Primary Diagnosis: <principal problem not specified> Long Term Goal(s): Knowledge of disease and therapeutic regimen to maintain health will improve  Short Term Goals: Ability to participate in decision making will improve, Ability to verbalize feelings will improve, Ability to disclose and discuss suicidal ideas, Ability to identify and develop effective coping behaviors will improve and Compliance with prescribed medications will improve  Medication Management: RN will administer medications as ordered by provider, will assess and evaluate patient's response and provide education to patient for prescribed medication. RN will report any adverse and/or side effects to prescribing provider.  Therapeutic Interventions: 1 on 1 counseling sessions, Psychoeducation, Medication administration, Evaluate responses to treatment, Monitor vital signs and CBGs as ordered, Perform/monitor CIWA, COWS, AIMS and Fall Risk screenings as ordered, Perform wound care treatments as ordered.  Evaluation of Outcomes: Not Met   LCSW Treatment Plan for Primary Diagnosis: <principal problem not specified> Long Term Goal(s): Safe transition to appropriate next level of care at discharge, Engage patient in therapeutic group addressing interpersonal concerns.  Short Term Goals: Engage patient in aftercare planning with referrals and resources, Increase social support, Increase ability to appropriately verbalize feelings, Increase emotional regulation, Facilitate acceptance of mental health diagnosis and concerns and Increase skills for wellness and recovery  Therapeutic  Interventions: Assess for all discharge  needs, 1 to 1 time with Education officer, museum, Explore available resources and support systems, Assess for adequacy in community support network, Educate family and significant other(s) on suicide prevention, Complete Psychosocial Assessment, Interpersonal group therapy.  Evaluation of Outcomes: Not Met   Progress in Treatment: Attending groups: Yes. Participating in groups: Yes. Taking medication as prescribed: Yes. Toleration medication: Yes. Family/Significant other contact made: No, will contact:  once permission is given. Patient understands diagnosis: Yes. Discussing patient identified problems/goals with staff: No. Medical problems stabilized or resolved: Yes. Denies suicidal/homicidal ideation: Yes. Issues/concerns per patient self-inventory: No. Other: none  New problem(s) identified: No, Describe:  none  New Short Term/Long Term Goal(s): detox, elimination of symptoms of psychosis, medication management for mood stabilization; elimination of SI thoughts; development of comprehensive mental wellness/sobriety plan.  Patient Goals:  "I just want to go home"  Discharge Plan or Barriers: CSW will assist patient in developing appropriate discharge plans.    Reason for Continuation of Hospitalization: Anxiety Depression Medication stabilization Suicidal ideation  Estimated Length of Stay: 1-7 days  Recreational Therapy: Patient Stressors: N/A Patient Goal: Patient will engage in groups without prompting or encouragement from LRT x3 group sessions within 5 recreation therapy group sessions.  Attendees: Patient: Edward Peters 04/04/2020 9:59 AM  Physician: Dr. Domingo Cocking, MD 04/04/2020 9:59 AM  Nursing: Collier Bullock, RN 04/04/2020 9:59 AM  RN Care Manager: 04/04/2020 9:59 AM  Social Worker: Assunta Curtis, MSW, LCSW 04/04/2020 9:59 AM  Recreational Therapist: Roanna Epley, Reather Converse, LRT 04/04/2020 9:59 AM  Other: Kiva [place], MSW, [place]  04/04/2020 9:59 AM  Other:  04/04/2020 9:59 AM  Other: 04/04/2020 9:59 AM    Scribe for Treatment Team: Rozann Lesches, LCSW 04/04/2020 9:59 AM

## 2020-04-04 NOTE — Progress Notes (Signed)
Patient admitted to unit , was tearful during admission process. Patient a poor historian at first but than began yelling why are we doing this to him and we have made him lose everything. Pt states because we are not discharging him it is our fault that he looses his job and house. Pt yells again why are you all doing this to me. Pt later calmed and states that he is not suicidal anymore, he just wants his kids. Reports girlfriend had a change of mind and left for no reason and took baby. Reports he has 2 kids from a previous relationship that has nothing to do with him. Pt continued to cry, reports he was trying to get help prior to her leaving, but she just changed her mind and left. Pt is currently denying SI, HI, AVH. Pt reports he just wants sleep. Reports he has not been able to sleep and he needs something to help him so that his mind will shut off for awhile. Writer contacted NP for prn sleep aid, given with good relief. Skin and contraband search completed and witnessed by Guido Sander, Charity fundraiser. Pt has an abrasion around the neck. Reportly was from the attempted SI by hanging. Pt also has a sore to left back thigh pink in color, scabbed over. Pt has tiny abrasions to front of lower legs. No contraband found. Oriented patient to room and unit. Fluid and nutrition offered. Pt refused. Medication given for sleep. Pt receptive and remains safe on unit with q 15 min checks.

## 2020-04-04 NOTE — Progress Notes (Signed)
Recreation Therapy Notes  INPATIENT RECREATION TR PLAN  Patient Details Name: Edward Peters MRN: [id number] DOB: May 29, 1985 Today's Date: 04/04/2020  Rec Therapy Plan Is patient appropriate for Therapeutic Recreation?: Yes Treatment times per week: at least 3 Estimated Length of Stay: 5-7 days TR Treatment/Interventions: Group participation (Comment)  Discharge Criteria Pt will be discharged from therapy if:: Discharged Treatment plan/goals/alternatives discussed and agreed upon by:: Patient/family  Discharge Summary     [name] 04/04/2020, 2:37 PM

## 2020-04-04 NOTE — [facility] Counselor (Signed)
CSW met with pt and attempted to do a PSA. Pt was not able to answer questions and was agitated and had a raised voiced throughout assessment so CSW decided to stop assessment. CSW informed pt that assessment would be attempted again with the [place] weekend social worker.  Fredi Geiler [place], MSW, LCSW-A 2/25/20223:06 PM
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
## 2020-04-04 NOTE — Plan of Care (Signed)
  Problem: Education: Goal: Knowledge of [facility] Education information/materials will improve Outcome: Not Progressing Goal: Emotional status will improve Outcome: Not Progressing Goal: Mental status will improve Outcome: Not Progressing   Problem: Coping: Goal: Ability to verbalize frustrations and anger appropriately will improve Outcome: Not Progressing

## 2020-04-04 NOTE — [facility] Group Notes (Signed)
LCSW Group Therapy Note  04/04/2020 3:01 PM  Type of Therapy and Topic:  Group Therapy:  Feelings around Relapse and Recovery  Participation Level:  Minimal   Description of Group:    Patients in this group will discuss emotions they experience before and after a relapse. They will process how experiencing these feelings, or avoidance of experiencing them, relates to having a relapse. Facilitator will guide patients to explore emotions they have related to recovery. Patients will be encouraged to process which emotions are more powerful. They will be guided to discuss the emotional reaction significant others in their lives may have to their relapse or recovery. Patients will be assisted in exploring ways to respond to the emotions of others without this contributing to a relapse.  Therapeutic Goals: 1. Patient will identify two or more emotions that lead to a relapse for them 2. Patient will identify two emotions that result when they relapse 3. Patient will identify two emotions related to recovery 4. Patient will demonstrate ability to communicate their needs through discussion and/or role plays   Summary of Patient Progress: Patient did attend group.  Patient shared about he tries to walk away and listen to music when he is feeling triggered.  Patient appeared withdrawn in group and sat with his head down for much of group.  Therapeutic Modalities:   Cognitive Behavioral Therapy Solution-Focused Therapy Assertiveness Training Relapse Prevention Therapy   Penni Homans, MSW, LCSW 04/04/2020 3:01 PM

## 2020-04-04 NOTE — [facility] Group Notes (Signed)
[facility] Group Notes:  (Nursing/MHT/Case Management/Adjunct)  Date:  04/04/2020  Time:  9:34 AM  Type of Therapy:  Community Meeting  Participation Level:  Active  Participation Quality:  Appropriate and Attentive  Affect:  Appropriate  Cognitive:  Alert and Appropriate  Insight:  Appropriate  Engagement in Group:  Engaged  Modes of Intervention:  Discussion, Education and Support  Summary of Progress/Problems:  Edward Peters [facility] 04/04/2020, 9:34 AM

## 2020-04-04 NOTE — Progress Notes (Signed)
Recreation Therapy Notes   Date: 04/04/2020  Time: 9:30 am   Location: Craft room   Behavioral response: Appropriate  Intervention Topic: Happiness   Discussion/Intervention:  Group content today was focused on Happiness. The group defined happiness and described where happiness comes from. Individuals identified what makes them happy and how they go about making others happy. Patients expressed things that stop them from being happy and ways they can improve their happiness. The group stated reasons why it is important to be happy. The group participated in the intervention "My Happiness", where they had a chance to identify and express things that make them happy. Clinical Observations/Feedback: Patient came to group and was focused on what peers and staff had to say about happiness. Individual was social with staff and peers while participating in the intervention. Shaverence Outlaw LRT/CTRS           Shaverence  Outlaw 04/04/2020 12:22 PM

## 2020-04-05 DIAGNOSIS — T71162D Asphyxiation due to hanging, intentional self-harm, subsequent encounter: Secondary | ICD-10-CM | POA: Diagnosis not present

## 2020-04-05 DIAGNOSIS — F332 Major depressive disorder, recurrent severe without psychotic features: Secondary | ICD-10-CM | POA: Diagnosis not present

## 2020-04-05 DIAGNOSIS — F1510 Other stimulant abuse, uncomplicated: Secondary | ICD-10-CM

## 2020-04-05 DIAGNOSIS — F410 Panic disorder [episodic paroxysmal anxiety] without agoraphobia: Secondary | ICD-10-CM | POA: Diagnosis not present
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
## 2020-04-05 NOTE — Plan of Care (Signed)
  Problem: Education: Goal: Knowledge of [facility] Education information/materials will improve Outcome: Progressing Goal: Emotional status will improve Outcome: Progressing Goal: Mental status will improve Outcome: Progressing Goal: Verbalization of understanding the information provided will improve Outcome: Progressing   Problem: Activity: Goal: Interest or engagement in activities will improve Outcome: Progressing Goal: Sleeping patterns will improve Outcome: Progressing   Problem: Coping: Goal: Ability to verbalize frustrations and anger appropriately will improve Outcome: Progressing Goal: Ability to demonstrate self-control will improve Outcome: Progressing   Problem: Health Behavior/Discharge Planning: Goal: Identification of resources available to assist in meeting health care needs will improve Outcome: Progressing Goal: Compliance with treatment plan for underlying cause of condition will improve Outcome: Progressing   Problem: Physical Regulation: Goal: Ability to maintain clinical measurements within normal limits will improve Outcome: Progressing   Problem: Safety: Goal: Periods of time without injury will increase Outcome: Progressing   Problem: Education: Goal: Utilization of techniques to improve thought processes will improve Outcome: Progressing Goal: Knowledge of the prescribed therapeutic regimen will improve Outcome: Progressing   Problem: Activity: Goal: Interest or engagement in leisure activities will improve Outcome: Progressing Goal: Imbalance in normal sleep/wake cycle will improve Outcome: Progressing   Problem: Coping: Goal: Coping ability will improve Outcome: Progressing Goal: Will verbalize feelings Outcome: Progressing   Problem: Health Behavior/Discharge Planning: Goal: Ability to make decisions will improve Outcome: Progressing Goal: Compliance with therapeutic regimen will improve Outcome: Progressing    Problem: Role Relationship: Goal: Will demonstrate positive changes in social behaviors and relationships Outcome: Progressing   Problem: Safety: Goal: Ability to disclose and discuss suicidal ideas will improve Outcome: Progressing Goal: Ability to identify and utilize support systems that promote safety will improve Outcome: Progressing   Problem: Self-Concept: Goal: Will verbalize positive feelings about self Outcome: Progressing Goal: Level of anxiety will decrease Outcome: Progressing   Problem: Education: Goal: Ability to make informed decisions regarding treatment will improve Outcome: Progressing   Problem: Coping: Goal: Coping ability will improve Outcome: Progressing   Problem: Health Behavior/Discharge Planning: Goal: Identification of resources available to assist in meeting health care needs will improve Outcome: Progressing   Problem: Medication: Goal: Compliance with prescribed medication regimen will improve Outcome: Progressing   Problem: Self-Concept: Goal: Ability to disclose and discuss suicidal ideas will improve Outcome: Progressing Goal: Will verbalize positive feelings about self Outcome: Progressing   

## 2020-04-05 NOTE — Plan of Care (Signed)
  Problem: Education: Goal: Knowledge of [facility] Education information/materials will improve Outcome: Progressing Goal: Emotional status will improve Outcome: Not Progressing Goal: Mental status will improve Outcome: Progressing Goal: Verbalization of understanding the information provided will improve Outcome: Not Progressing   Problem: Activity: Goal: Interest or engagement in activities will improve Outcome: Not Progressing Goal: Sleeping patterns will improve Outcome: Not Progressing   Problem: Coping: Goal: Ability to verbalize frustrations and anger appropriately will improve Outcome: Progressing Goal: Ability to demonstrate self-control will improve Outcome: Progressing   Problem: Health Behavior/Discharge Planning: Goal: Compliance with treatment plan for underlying cause of condition will improve Outcome: Progressing

## 2020-04-05 NOTE — Progress Notes (Signed)
Patient continues to be flat and withdrawn. Isolative to self and room. Denies SI, HI, AVH but endorses depression. States he just wants to get out and go home. Reports he needs to get back to work and take care of his 8 horses. Voiced no other complaints or concerns.  Encouragement and support offered. Safety checks maintained. Medications given as prescribed. Pt receptive and remains safe on unit with q 15 min checks.

## 2020-04-05 NOTE — Progress Notes (Signed)
Patient has been quiet and withdrawn. Isolative. Not currently voicing any SI, HI or AVH. Will continue to monitor

## 2020-04-05 NOTE — [facility] Group Notes (Signed)
[facility] LCSW Group Therapy Note  Date/Time:  04/05/2020 1:14 PM-2:00 PM   Type of Therapy and Topic:  Group Therapy:  Healthy and Unhealthy Supports  Participation Level:  None   Description of Group:  Patients in this group were introduced to the idea of adding a variety of healthy supports to address the various needs in their lives.Patients discussed what additional healthy supports could be helpful in their recovery and wellness after discharge in order to prevent future hospitalizations.   An emphasis was placed on using counselor, doctor, therapy groups, 12-step groups, and problem-specific support groups to expand supports.  They also worked as a group on developing a specific plan for several patients to deal with unhealthy supports through boundary-setting, psychoeducation with loved ones, and even termination of relationships.   Therapeutic Goals:   1)  discuss importance of adding supports to stay well once out of the hospital  2)  compare healthy versus unhealthy supports and identify some examples of each  3)  generate ideas and descriptions of healthy supports that can be added  4)  offer mutual support about how to address unhealthy supports  5)  encourage active participation in and adherence to discharge plan    Summary of Patient Progress: Patient did not participate during group and was quiet. Patient at the end of the group asked CSW if he was going home this weekend and he assumes since the other doctor isn't here that he won't be going anywhere.   Therapeutic Modalities:   Motivational Interviewing Brief Solution-Focused Therapy  Susa Simmonds, Theresia Majors 04/05/2020  2:51 PM

## 2020-04-06 NOTE — [facility] Suicide Risk Assessment (Signed)
[facility] INPATIENT:  Family/Significant Other Suicide Prevention Education  Suicide Prevention Education:  Patient Refusal for Family/Significant Other Suicide Prevention Education: The patient Edward Peters has refused to provide written consent for family/significant other to be provided Family/Significant Other Suicide Prevention Education during admission and/or prior to discharge.  Physician notified.   CSW reviewed SPE information with patient and provided him a brochure.   Carollee Herter  Tevan Marian 04/06/2020, 1:00 PM

## 2020-04-06 NOTE — [facility] Group Notes (Signed)
LCSW Group Therapy 04/06/20: 1:20 PM- 2:15 PM    Type of Therapy and Topic:  Group Therapy:  Setting Goals   Participation Level:  Active   Description of Group: In this process group, patients discussed using strengths to work toward goals and address challenges.  Patients identified two positive things about themselves and one goal they were working on.  Patients were given the opportunity to share openly and support each other's plan for self-empowerment.  The group discussed the value of gratitude and were encouraged to have a daily reflection of positive characteristics or circumstances.  Patients were encouraged to identify a plan to utilize their strengths to work on current challenges and goals.   Therapeutic Goals 1. Patient will verbalize personal strengths/positive qualities and relate how these can assist with achieving desired personal goals 2. Patients will verbalize affirmation of peers plans for personal change and goal setting 3. Patients will explore the value of gratitude and positive focus as related to successful achievement of goals 4. Patients will verbalize a plan for regular reinforcement of personal positive qualities and circumstances.   Summary of Patient Progress: Patient was upset about having to come to group because the doctor woke him up. At times patient had his head down on the table. Patient stated one of his goals is to go home and in the next couple of years and have a happy family in the next 5 years.        Therapeutic Modalities Cognitive Behavioral Therapy Motivational Interviewing

## 2020-04-06 NOTE — Plan of Care (Signed)
  Problem: Education: Goal: Emotional status will improve Outcome: Progressing Goal: Mental status will improve Outcome: Progressing   Problem: Activity: Goal: Interest or engagement in activities will improve Outcome: Not Progressing Goal: Sleeping patterns will improve Outcome: Progressing   Problem: Coping: Goal: Ability to verbalize frustrations and anger appropriately will improve Outcome: Not Progressing   Problem: Safety: Goal: Periods of time without injury will increase Outcome: Progressing

## 2020-04-06 NOTE — Progress Notes (Signed)
Patient continues to be flat, isolative to self, room and withdrawn. Pt comes out for meds and meals. Did complain of anxiety and prn given with good relief. Continues to deny HI, SI, AVH. No other complaints or concerns voiced. Patient was noted in dayroom talking with peer.  Encouragement and support provided. Safety checks maintained. Medications given as prescribed. Pt receptive and remains safe on unit with q 15 min checks.

## 2020-04-06 NOTE — B[facility] Counselor (Signed)
Adult Comprehensive Assessment  Patient ID: Edward Peters, male   DOB: 03-Sep-1985, 35 y.o.   MRN: [id number]  Information Source: Information source: Patient  Current Stressors:  Patient states their primary concerns and needs for treatment are:: [name] stated that he tried to end his life Patient states their goals for this hospitilization and ongoing recovery are:: Patient stated he has no goals besides leaving the hospital Educational / Learning stressors: No Employment / Job issues: No. 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 Family Relationships: Patient states he really doesn't speak to his family. Patient the mother of his children will not let him see his children Financial / Lack of resources (include bankruptcy): Patient stated he has no income Housing / Lack of housing: Has own home. Lives by himself Physical health (include injuries & life threatening diseases): None Social relationships: "I have a few friends" Substance abuse: Denied Bereavement / Loss: Grandfather passed before Christmas of 2021  Living/Environment/Situation:  Living Arrangements: Alone Living conditions (as described by patient or guardian): Fine Who else lives in the home?: No one How long has patient lived in current situation?: 3 years What is atmosphere in current home: Comfortable  Family History:  Marital status: Single Are you sexually active?: No What is your sexual orientation?: Heterosexual Has your sexual activity been affected by drugs, alcohol, medication, or emotional stress?: Denied Does patient have children?: Yes How many children?: 3 How is patient's relationship with their children?: Patient stated his ex will not allow him to see the children  Childhood History:  By whom was/is the patient raised?: Both parents Description of patient's relationship with caregiver when they were a child: Normal relationship Patient's description of current  relationship with people who raised him/her: Speaks to them once in awhile How were you disciplined when you got in trouble as a child/adolescent?: "I tried not to get in trouble" Did patient suffer any verbal/emotional/physical/sexual abuse as a child?: No Did patient suffer from severe childhood neglect?: No Has patient ever been sexually abused/assaulted/raped as an adolescent or adult?: No Was the patient ever a victim of a crime or a disaster?: No Witnessed domestic violence?: No Has patient been affected by domestic violence as an adult?: No  Education:  Highest grade of school patient has completed: 12th grade Currently a student?: No Learning disability?: No  Employment/Work Situation:   Employment situation: Unemployed Patient's job has been impacted by current illness: No 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 Where was the patient employed at that time?: Off and on for years Has patient ever been in the [employer]?: No  Financial Resources:   Surveyor, quantity resources: No income Does patient have a Lawyer or guardian?: No  Alcohol/Substance Abuse:   What has been your use of drugs/alcohol within the last 12 months?: No If attempted suicide, did drugs/alcohol play a role in this?: No Alcohol/Substance Abuse Treatment Hx: Denies past history Has alcohol/substance abuse ever caused legal problems?: No  Social Support System:   Conservation officer, nature Support System: Fair Museum/gallery exhibitions officer System: Patient did not say but said he has a few friends Type of faith/religion: No How does patient's faith help to cope with current illness?: No  Leisure/Recreation:   Do You Have Hobbies?: No  Strengths/Needs:   What is the patient's perception of their strengths?: Patient could not answer Patient states they can use these personal strengths during their treatment to contribute to their  recovery: Patient could not answer Patient states  these barriers may affect/interfere with their treatment: No Patient states these barriers may affect their return to the community: No Other important information patient would like considered in planning for their treatment: No  Discharge Plan:   Currently receiving community mental health services: Yes (From Whom) ([facility]) Patient states concerns and preferences for aftercare planning are: Patient would like to continue with [facility] Patient states they will know when they are safe and ready for discharge when: Patient stated he is ready now Does patient have access to transportation?: Yes Does patient have financial barriers related to discharge medications?: No Patient description of barriers related to discharge medications: No Will patient be returning to same living situation after discharge?:  (Patient stated he was not sure)  Summary/Recommendations:   Summary and Recommendations (to be completed by the evaluator): Patient is a 35 year old male who presents to [facility] ED due to a suicide attempt to end his life. Patient stated his current stressors include not being able to see his children and nw currently being unemployed. Patient could not list any goals and stated that he wants to leave the hospital. Patient was not able to list any hobbies or any personal strengths about himself. Patient has a few social relationships and speaks with his parents ocasionally. Patient stated he recevies outpatient treatment services at [facility]. Patient stated his plan is to resume therapy with [facility] when he is discharged from the hospital. Patient will benefit from crisis stabilization, medication evaluation, group therapy and psychoeducation, in addition to case management for discharge planning. At discharge it is recommended that Patient adhere to the established discharge plan and continue in treatment.  Susa Simmonds. 04/06/2020

## 2020-04-06 NOTE — Progress Notes (Signed)
Patient continues to be flat, withdrawn and isolative to room and to self. Denies SI.

## 2020-04-06 NOTE — Progress Notes (Addendum)
[facility] MD Progress Note  04/06/2020 8:56 AM Edward Peters  MRN:  [id number]    Patient goes by Edward Peters.  Subjective: "I am just tired."   Principal Problem: Major depressive disorder, recurrent episode, severe (HCC) Diagnosis: Principal Problem:   Major depressive disorder, recurrent episode, severe (HCC) Active Problems:   Panic disorder   Suicide attempt by hanging (HCC)   Amphetamine abuse (HCC)  Total Time spent with patient: 35 minutes   History of Present Illness: 35 year old male brought in by law enforcement after attempting to commit suicide via hanging. Patient had no acute events overnight, medication compliant, attending to ADLs.  On evaluation today, patient is sleeping in bed, but arouses with some difficulty.  Nurses report that patient has been smiling more, and after fully awake he does smile.  He states that in group yesterday he was able to identify a friend that is a social support, and states that his kids are also supportive, even if he cannot see them in person but is able to talk to them on the phone.  States that he has not had any contact with his ex-wife or current wife since he has been in the hospital.  Discussed importance of having a family meeting prior to discharge so that he can have clear expectations at discharge in these relationships.  Believes that his wife will not return home.  He feels that he will likely need to sell their horses, as she will not be home to help.  He also notes that when he goes outside and sees the horses, it "keeps me down/depressed."  Denies suicidal ideation today.  He denies HI, he denies AVH.  He is not future oriented, and has not made plans for discharge.  He becomes easily agitated when asked to go to group therapy, and mutters, "I do not know why you even ask if we want to go since you make [place] go anyway."  He does provisionally go to group, otherwise isolates to room during the day except for meals.   Past Psychiatric  History: He recently established care with [facility] and has peer support and therapist. He has an appointment with the psychiatrist on March 8th. He notes he has been on medication in the past, but cannot recall the name. He also states he has been at our hospital before. Per chart review, he has presented to our emergency room for acute intoxication and public belligerence in 2019. He was also admitted for 1 week in 2012. No suicide attempts prior to this hanging. Past legal charge for assault in 2015.   Past Medical History: History reviewed. No pertinent past medical history. History reviewed. No pertinent surgical history.   Family History: History reviewed. No pertinent family history.   Family Psychiatric  History: multiple members of his family with mental health issues, and a cousin who committed suicide.   Social History:  Social History   Substance and Sexual Activity  Alcohol Use Yes   Comment: occassional monthly     Social History   Substance and Sexual Activity  Drug Use Never    Social History   Socioeconomic History  . Marital status: Single    Spouse name: Not on file  . Number of children: Not on file  . Years of education: Not on file  . Highest education level: Not on file  Occupational History  . Not on file  Tobacco Use  . Smoking status: Never Smoker  . Smokeless tobacco: Current User  Vaping Use  . Vaping Use: Never used  Substance and Sexual Activity  . Alcohol use: Yes    Comment: occassional monthly  . Drug use: Never  . Sexual activity: Yes  Other Topics Concern  . Not on file  Social History Narrative  . Not on file   Social Determinants of Health   Financial Resource Strain: Not on file  Food Insecurity: Not on file  Transportation Needs: Not on file  Physical Activity: Not on file  Stress: Not on file  Social Connections: Not on file   Additional Social History:           [place] farm with 8 horses.  Wife and  68-month-old son have moved out of the house with mother-in-law. Previously employed as a Naval architect driving night shifts.  He is uncertain of appointment after discharge.      He is divorced with 69-year-old daughter and 20-year-old son who live with their mother in [place] whom he has not been able to see for the past 1.5 months.        Sleep: Good/hypersomnolence  Appetite:  Fair  Current Medications: Current Facility-Administered Medications  Medication Dose Route Frequency Provider Last Rate Last Admin  . acetaminophen (TYLENOL) tablet 650 mg  650 mg Oral Q6H PRN Gillermo Murdoch, NP      . alum & mag hydroxide-simeth (MAALOX/MYLANTA) 200-200-20 MG/5ML suspension 30 mL  30 mL Oral Q4H PRN Gillermo Murdoch, NP      . ARIPiprazole (ABILIFY) tablet 5 mg  5 mg Oral Daily Jesse Sans, MD   5 mg at 04/06/20 0757  . hydrOXYzine (ATARAX/VISTARIL) tablet 50 mg  50 mg Oral TID PRN Jesse Sans, MD   50 mg at 04/06/20 0757  . magnesium hydroxide (MILK OF MAGNESIA) suspension 30 mL  30 mL Oral Daily PRN Gillermo Murdoch, NP      . PARoxetine (PAXIL) tablet 20 mg  20 mg Oral Daily Gillermo Murdoch, NP   20 mg at 04/06/20 0757  . traZODone (DESYREL) tablet 100 mg  100 mg Oral QHS PRN Gillermo Murdoch, NP   100 mg at 04/03/20 2321    Lab Results:  No results found for this or any previous visit (from the past 48 hour(s)).  Blood Alcohol level:  Lab Results  Component Value Date   ETH <10 04/02/2020   ETH 85 (H) 11/09/2017    Metabolic Disorder Labs: No results found for: HGBA1C, MPG No results found for: PROLACTIN No results found for: CHOL, TRIG, HDL, CHOLHDL, VLDL, LDLCALC  Physical Findings: AIMS: Facial and Oral Movements Muscles of Facial Expression: None, normal Lips and Perioral Area: None, normal Jaw: None, normal Tongue: None, normal,Extremity Movements Upper (arms, wrists, hands, fingers): None, normal Lower (legs, knees, ankles, toes):  None, normal, Trunk Movements Neck, shoulders, hips: None, normal, Overall Severity Severity of abnormal movements (highest score from questions above): None, normal Incapacitation due to abnormal movements: None, normal Patient's awareness of abnormal movements (rate only patient's report): No Awareness, Dental Status Current problems with teeth and/or dentures?: No Does patient usually wear dentures?: No  CIWA:    COWS:     Musculoskeletal: Strength & Muscle Tone: within normal limits Gait & Station: normal Patient leans: N/A  Psychiatric Specialty Exam: Physical Exam Vitals and nursing note reviewed.  Constitutional:      Appearance: Normal appearance.  HENT:     Head: Normocephalic and atraumatic.     Nose: Nose normal.  Eyes:     Extraocular Movements: Extraocular movements intact.  Cardiovascular:     Rate and Rhythm: Normal rate.  Pulmonary:     Effort: Pulmonary effort is normal. No respiratory distress.  Musculoskeletal:        General: Normal range of motion.     Cervical back: Normal range of motion.  Neurological:     General: No focal deficit present.     Mental Status: He is alert and oriented to person, place, and time.     Review of Systems  Constitutional: Negative.   Respiratory: Negative.   Cardiovascular: Negative.   Gastrointestinal: Negative.   Musculoskeletal: Negative.   Psychiatric/Behavioral: Positive for dysphoric mood. Negative for agitation, behavioral problems, confusion, decreased concentration, hallucinations, self-injury, sleep disturbance and suicidal ideas. The patient is not nervous/anxious.     Blood pressure 119/79, pulse 76, temperature 98.3 F (36.8 C), temperature source Oral, resp. rate 18, height 6' (1.829 m), weight 75.8 kg, SpO2 98 %.Body mass index is 22.65 kg/m.  General Appearance: Casual  Eye Contact:  Minimal  Speech:  Clear and Coherent and Normal Rate  Volume:  Decreased  Mood:  Depressed  Affect:  Blunt,  Depressed and Easily frustrated  Thought Process:  Coherent and Descriptions of Associations: Intact  Orientation:  Full (Time, Place, and Person)  Thought Content:  Logical, Hallucinations: None and Rumination  Suicidal Thoughts:  Denies today  Homicidal Thoughts:  No  Memory:  Immediate;   Fair Recent;   Good Remote;   Good  Judgement:  Poor  Insight:  Shallow  Psychomotor Activity:  Psychomotor Retardation  Concentration:  Concentration: Fair and Attention Span: Fair  Recall:  Good  Fund of Knowledge:  Good  Language:  Good  Akathisia:  No  Handed:  Right  AIMS (if indicated):     Assets:  Communication Skills Housing Physical Health  ADL's:  Intact  Cognition:  WNL  Sleep:  Number of Hours: 8.15     Treatment Plan Summary: Daily contact with patient to assess and evaluate symptoms and progress in treatment and Medication management   Assessment:  Edward Peters is a 35 y.o. male with major depressive disorder and panic disorder admitted following a suicide attempt by hanging.  There is a history of alcohol use disorder recorded 2 years ago.  He was not intoxicated with alcohol at time of suicide attempt, amphetamines were detected on urine drug screen.     Diagnosis: Major depressive disorder, recurrent episode, severe (HCC)    Pertinent findings today:  Patient denies suicidal ideation, but is not yet forward thinking.  Believes that medications are helping decrease suicidal thoughts.      Treatment Plan Summary: Daily contact with patient to assess and evaluate symptoms and progress in treatment.   Scheduled medications/labs:  1)MDD, recurrent, severe, without psychotic features- established problem, unstable - Recent suicide attempt via hanging. Disappointed that he did not die, and feels no reason to keep living now. Endorsing poor sleep, poor appetite, weight loss, low energy, anhedonia, hopelessness, helplessness, and worthlessness  Continue - Paxil 20 mg  daily - Abilify 5 mg daily  2) Panic disorder- established problem, unstable - Paxil as above   PRN's : Vistaril 50 mg 3 times daily PRN  for anxiety Trazodone 100 mg as needed for sleep    Psychosocial:   1. Encouragement to attend group therapies. 2. Encourage patient to learn and implement coping strategies to avoid need for PRN medications. 3. Encouragement for  medication compliance. 4. Disposition in progress, appreciate social work assistance. 5. Patient agreeable to family meeting.    Develop treatment plan to be active in therapy to prevent further suicide attempts; decrease substance use upon discharge and the need for readmission.  Psycho-social education regarding self care.     Mariel Craft, MD 04/06/2020, 8:56 AM

## 2020-04-06 NOTE — Plan of Care (Signed)
  Problem: Education: Goal: Knowledge of [facility] Education information/materials will improve Outcome: Progressing Goal: Emotional status will improve Outcome: Progressing Goal: Mental status will improve Outcome: Progressing Goal: Verbalization of understanding the information provided will improve Outcome: Progressing   Problem: Activity: Goal: Interest or engagement in activities will improve Outcome: Progressing Goal: Sleeping patterns will improve Outcome: Progressing   Problem: Coping: Goal: Ability to verbalize frustrations and anger appropriately will improve Outcome: Progressing Goal: Ability to demonstrate self-control will improve Outcome: Progressing   Problem: Health Behavior/Discharge Planning: Goal: Identification of resources available to assist in meeting health care needs will improve Outcome: Progressing Goal: Compliance with treatment plan for underlying cause of condition will improve Outcome: Progressing   Problem: Physical Regulation: Goal: Ability to maintain clinical measurements within normal limits will improve Outcome: Progressing   Problem: Safety: Goal: Periods of time without injury will increase Outcome: Progressing   Problem: Education: Goal: Utilization of techniques to improve thought processes will improve Outcome: Progressing Goal: Knowledge of the prescribed therapeutic regimen will improve Outcome: Progressing   Problem: Activity: Goal: Interest or engagement in leisure activities will improve Outcome: Progressing Goal: Imbalance in normal sleep/wake cycle will improve Outcome: Progressing   Problem: Coping: Goal: Coping ability will improve Outcome: Progressing Goal: Will verbalize feelings Outcome: Progressing   Problem: Health Behavior/Discharge Planning: Goal: Ability to make decisions will improve Outcome: Progressing Goal: Compliance with therapeutic regimen will improve Outcome: Progressing    Problem: Role Relationship: Goal: Will demonstrate positive changes in social behaviors and relationships Outcome: Progressing   Problem: Safety: Goal: Ability to disclose and discuss suicidal ideas will improve Outcome: Progressing Goal: Ability to identify and utilize support systems that promote safety will improve Outcome: Progressing   Problem: Self-Concept: Goal: Will verbalize positive feelings about self Outcome: Progressing Goal: Level of anxiety will decrease Outcome: Progressing   Problem: Education: Goal: Ability to make informed decisions regarding treatment will improve Outcome: Progressing   Problem: Coping: Goal: Coping ability will improve Outcome: Progressing   Problem: Health Behavior/Discharge Planning: Goal: Identification of resources available to assist in meeting health care needs will improve Outcome: Progressing   Problem: Medication: Goal: Compliance with prescribed medication regimen will improve Outcome: Progressing   Problem: Self-Concept: Goal: Ability to disclose and discuss suicidal ideas will improve Outcome: Progressing Goal: Will verbalize positive feelings about self Outcome: Progressing   

## 2020-04-07 MED ORDER — TRAZODONE HCL 100 MG PO TABS: 100.0000 mg | ORAL_TABLET | Freq: Every evening | ORAL | 0 refills | Status: DC | PRN

## 2020-04-07 MED ORDER — HYDROXYZINE HCL 50 MG PO TABS: 50.0000 mg | ORAL_TABLET | Freq: Three times a day (TID) | ORAL | 0 refills | Status: DC | PRN

## 2020-04-07 MED ORDER — PAROXETINE HCL 20 MG PO TABS: 20.0000 mg | ORAL_TABLET | Freq: Every day | ORAL | 0 refills | Status: DC

## 2020-04-07 MED ORDER — ARIPIPRAZOLE 5 MG PO TABS: 5.0000 mg | ORAL_TABLET | Freq: Every day | ORAL | 0 refills | Status: DC

## 2020-04-07 NOTE — Progress Notes (Addendum)
Patient presents with a flat affect. He denies SI, HI, and AVH. Patient does endorse anxiety and was given hydroxyzinel prn. Patient is less isolative and is observed to be in the dayroom watching TV. Interaction is minimal.   Patient is given medications per MD orders. Support and encouragement is provided. Pt remains safe on the unit at this time and q15 minute safety checks are maintained.

## 2020-04-07 NOTE — [facility] Group Notes (Signed)
[facility] Group Notes:  (Nursing/MHT/Case Management/Adjunct)  Date:  04/07/2020  Time:  8:53 PM  Type of Therapy:  Group Therapy  Participation Level:  Did Not Attend   [name] 04/07/2020, 8:53 PM

## 2020-04-07 NOTE — Plan of Care (Signed)
  Problem: Education: Goal: Knowledge of [facility] Education information/materials will improve Outcome: Progressing Goal: Emotional status will improve Outcome: Progressing Goal: Mental status will improve Outcome: Progressing Goal: Verbalization of understanding the information provided will improve Outcome: Progressing   Problem: Activity: Goal: Interest or engagement in activities will improve Outcome: Progressing Goal: Sleeping patterns will improve Outcome: Progressing   Problem: Coping: Goal: Ability to verbalize frustrations and anger appropriately will improve Outcome: Progressing Goal: Ability to demonstrate self-control will improve Outcome: Progressing   Problem: Health Behavior/Discharge Planning: Goal: Identification of resources available to assist in meeting health care needs will improve Outcome: Progressing Goal: Compliance with treatment plan for underlying cause of condition will improve Outcome: Progressing   Problem: Physical Regulation: Goal: Ability to maintain clinical measurements within normal limits will improve Outcome: Progressing   Problem: Safety: Goal: Periods of time without injury will increase Outcome: Progressing   Problem: Education: Goal: Utilization of techniques to improve thought processes will improve Outcome: Progressing Goal: Knowledge of the prescribed therapeutic regimen will improve Outcome: Progressing   Problem: Activity: Goal: Interest or engagement in leisure activities will improve Outcome: Progressing Goal: Imbalance in normal sleep/wake cycle will improve Outcome: Progressing   Problem: Coping: Goal: Coping ability will improve Outcome: Progressing Goal: Will verbalize feelings Outcome: Progressing   Problem: Health Behavior/Discharge Planning: Goal: Ability to make decisions will improve Outcome: Progressing Goal: Compliance with therapeutic regimen will improve Outcome: Progressing    Problem: Role Relationship: Goal: Will demonstrate positive changes in social behaviors and relationships Outcome: Progressing   Problem: Safety: Goal: Ability to disclose and discuss suicidal ideas will improve Outcome: Progressing Goal: Ability to identify and utilize support systems that promote safety will improve Outcome: Progressing   Problem: Self-Concept: Goal: Will verbalize positive feelings about self Outcome: Progressing Goal: Level of anxiety will decrease Outcome: Progressing   Problem: Education: Goal: Ability to make informed decisions regarding treatment will improve Outcome: Progressing   Problem: Coping: Goal: Coping ability will improve Outcome: Progressing   Problem: Health Behavior/Discharge Planning: Goal: Identification of resources available to assist in meeting health care needs will improve Outcome: Progressing   Problem: Medication: Goal: Compliance with prescribed medication regimen will improve Outcome: Progressing   Problem: Self-Concept: Goal: Ability to disclose and discuss suicidal ideas will improve Outcome: Progressing Goal: Will verbalize positive feelings about self Outcome: Progressing   

## 2020-04-07 NOTE — [facility] Group Notes (Signed)
LCSW Group Therapy Note     04/07/2020 2:17 PM     Type of Therapy and Topic:  Group Therapy:  Overcoming Obstacles     Participation Level:  Minimal     Description of Group:     In this group patients will be encouraged to explore what they see as obstacles to their own wellness and recovery. They will be guided to discuss their thoughts, feelings, and behaviors related to these obstacles. The group will process together ways to cope with barriers, with attention given to specific choices patients can make. Each patient will be challenged to identify changes they are motivated to make in order to overcome their obstacles. This group will be process-oriented, with patients participating in exploration of their own experiences as well as giving and receiving support and challenge from other group members.     Therapeutic Goals:  1.    Patient will identify personal and current obstacles as they relate to admission.  2.    Patient will identify barriers that currently interfere with their wellness or overcoming obstacles.  3.    Patient will identify feelings, thought process and behaviors related to these barriers.  4.    Patient will identify two changes they are willing to make to overcome these obstacles:      Summary of Patient Progress:   Pt participated minimally in group and did not participate in the discussion beyond talking during the icebreaker.    Therapeutic Modalities:    Cognitive Behavioral Therapy  Solution Focused Therapy  Motivational Interviewing  Relapse Prevention Therapy     Mele Sylvester [place], MSW, LCSW-A  04/07/2020 2:17 PM
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
## 2020-04-07 NOTE — Progress Notes (Signed)
Recreation Therapy Notes   Date: 04/07/2020  Time: 9:30 am   Location: Craft room     Behavioral response: N/A   Intervention Topic: Self-esteem   Discussion/Intervention: Patient did not attend group.   Clinical Observations/Feedback:  Patient did not attend group.   Deriana Vanderhoef LRT/CTRS        Dailynn Nancarrow 04/07/2020 10:51 AM

## 2020-04-07 NOTE — Progress Notes (Signed)
Patient pleasant and cooperative with sad affect. Appropriate with staff and peers. Denies any SI, Hi, AVH. Reports not having a good conversation with Ex today. Reports he is worried about his dog. Pt had no other complaints or concerns. Pt looking forward to discharge on tomorrow.  Encouragement and support provided. Safety checks maintained. Medications given as prescribed. Prn given for sleep with good relief. Pt receptive and remains safe on unit with q 15 min checks.

## 2020-04-07 NOTE — Progress Notes (Signed)
Patient has remained isolative to room and to self. Not voicing any SI. Still appears very flat and depressed.

## 2020-04-07 NOTE — Progress Notes (Signed)
[facility] MD Progress Note  04/07/2020 3:28 PM Edward Peters  MRN:  [id number]   Patient goes by Edward Peters.  Subjective: "I feel better today"   Principal Problem: Major depressive disorder, recurrent episode, severe (HCC) Diagnosis: Principal Problem:   Major depressive disorder, recurrent episode, severe (HCC) Active Problems:   Panic disorder   Suicide attempt by hanging (HCC)   Amphetamine abuse (HCC)  Total Time spent with patient: 35 minutes   History of Present Illness: 35 year old male brought in by law enforcement after attempting to commit suicide via hanging. Patient had no acute events overnight, medication compliant, attending to ADLs.  On evaluation today, patient is more engaged with discussion. He is no longer irritable on exam, and appears brighter. He smiles and laughs appropriately during conversation. He has been attending groups, and notes that they have been helpful. He found the group on positive and negative influences particularly helpful. He notes that his mother has called, and has been tremendously supportive of him during the hospital stay. This has led him to feel somewhat conflicted as she previously sided with his ex-wife taking his children in the divorce. He is unsure if he wants to reopen this relationship with her. He notes that his aunt, three friends, and his children are also sources of support that are much more positive in his life. He discusses some unhealthy communication styles he had with his girlfriend Rolly Salter, and notes that this was a somewhat toxic relationship. He still wants to be involved with his child's life, but feels it might be better if he does not get back with [facility]. He denies suicidal ideations, homicidal ideations, auditory hallucinations, and visual hallucinations.   Conference call completed with Cheryl Flash [phone]. She confirms that she has terminated the relationship, and will not be returning to live with Edward Peters. She does note that  he call call and ask about Cash anytime. Whether he can see him will depend on outcome of CPS hearing, as they are still in their 45 day period. Edward Peters noted understanding during the call, and did not have any follow-up questions.  Time spent debriefing this phone call now that it has been confirmed that she is not open for reconciliation. He notes that he does hope he will be able to visit Cash in the future, but feels that it is probably best the relationship ends as they both have mental health issues to work on.       Past Psychiatric History: See H&P  Past Medical History: History reviewed. No pertinent past medical history. History reviewed. No pertinent surgical history.   Family History: History reviewed. No pertinent family history.   Family Psychiatric  History: See H&P  Social History:  Social History   Substance and Sexual Activity  Alcohol Use Yes   Comment: occassional monthly     Social History   Substance and Sexual Activity  Drug Use Never    Social History   Socioeconomic History  . Marital status: Single    Spouse name: Not on file  . Number of children: Not on file  . Years of education: Not on file  . Highest education level: Not on file  Occupational History  . Not on file  Tobacco Use  . Smoking status: Never Smoker  . Smokeless tobacco: Current User  Vaping Use  . Vaping Use: Never used  Substance and Sexual Activity  . Alcohol use: Yes    Comment: occassional monthly  . Drug use: Never  .  Sexual activity: Yes  Other Topics Concern  . Not on file  Social History Narrative  . Not on file   Social Determinants of Health   Financial Resource Strain: Not on file  Food Insecurity: Not on file  Transportation Needs: Not on file  Physical Activity: Not on file  Stress: Not on file  Social Connections: Not on file   Additional Social History:                   Sleep: Good/hypersomnolence  Appetite:  Fair  Current  Medications: Current Facility-Administered Medications  Medication Dose Route Frequency Provider Last Rate Last Admin  . acetaminophen (TYLENOL) tablet 650 mg  650 mg Oral Q6H PRN Gillermo Murdoch, NP      . alum & mag hydroxide-simeth (MAALOX/MYLANTA) 200-200-20 MG/5ML suspension 30 mL  30 mL Oral Q4H PRN Gillermo Murdoch, NP      . ARIPiprazole (ABILIFY) tablet 5 mg  5 mg Oral Daily Jesse Sans, MD   5 mg at 04/07/20 0757  . hydrOXYzine (ATARAX/VISTARIL) tablet 50 mg  50 mg Oral TID PRN Jesse Sans, MD   50 mg at 04/07/20 0759  . magnesium hydroxide (MILK OF MAGNESIA) suspension 30 mL  30 mL Oral Daily PRN Gillermo Murdoch, NP      . PARoxetine (PAXIL) tablet 20 mg  20 mg Oral Daily Gillermo Murdoch, NP   20 mg at 04/07/20 0757  . traZODone (DESYREL) tablet 100 mg  100 mg Oral QHS PRN Gillermo Murdoch, NP   100 mg at 04/03/20 2321    Lab Results:  No results found for this or any previous visit (from the past 48 hour(s)).  Blood Alcohol level:  Lab Results  Component Value Date   ETH <10 04/02/2020   ETH 85 (H) 11/09/2017    Metabolic Disorder Labs: No results found for: HGBA1C, MPG No results found for: PROLACTIN No results found for: CHOL, TRIG, HDL, CHOLHDL, VLDL, LDLCALC  Physical Findings: AIMS: Facial and Oral Movements Muscles of Facial Expression: None, normal Lips and Perioral Area: None, normal Jaw: None, normal Tongue: None, normal,Extremity Movements Upper (arms, wrists, hands, fingers): None, normal Lower (legs, knees, ankles, toes): None, normal, Trunk Movements Neck, shoulders, hips: None, normal, Overall Severity Severity of abnormal movements (highest score from questions above): None, normal Incapacitation due to abnormal movements: None, normal Patient's awareness of abnormal movements (rate only patient's report): No Awareness, Dental Status Current problems with teeth and/or dentures?: No Does patient usually wear dentures?:  No  CIWA:    COWS:     Musculoskeletal: Strength & Muscle Tone: within normal limits Gait & Station: normal Patient leans: N/A  Psychiatric Specialty Exam: Physical Exam Vitals and nursing note reviewed.  Constitutional:      Appearance: Normal appearance.  HENT:     Head: Normocephalic and atraumatic.     Nose: Nose normal.  Eyes:     Extraocular Movements: Extraocular movements intact.  Cardiovascular:     Rate and Rhythm: Normal rate.  Pulmonary:     Effort: Pulmonary effort is normal. No respiratory distress.  Musculoskeletal:        General: Normal range of motion.     Cervical back: Normal range of motion.  Neurological:     General: No focal deficit present.     Mental Status: He is alert and oriented to person, place, and time.     Review of Systems  Constitutional: Negative.   Respiratory: Negative.  Cardiovascular: Negative.   Gastrointestinal: Negative.   Endocrine: Negative.   Musculoskeletal: Negative.   Allergic/Immunologic: Negative.   Neurological: Negative.   Psychiatric/Behavioral: Negative for agitation, behavioral problems, confusion, decreased concentration, dysphoric mood, hallucinations, self-injury, sleep disturbance and suicidal ideas. The patient is not nervous/anxious.     Blood pressure 127/83, pulse 93, temperature 97.9 F (36.6 C), temperature source Oral, resp. rate 17, height 6' (1.829 m), weight 75.8 kg, SpO2 99 %.Body mass index is 22.65 kg/m.  General Appearance: Casual  Eye Contact:  Fair  Speech:  Clear and Coherent and Normal Rate  Volume:  Normal  Mood:  Euthymic  Affect:  Congruent and brighter  Thought Process:  Coherent and Linear  Orientation:  Full (Time, Place, and Person)  Thought Content:  Logical  Suicidal Thoughts:  No  Homicidal Thoughts:  No  Memory:  Immediate;   Fair Recent;   Good Remote;   Good  Judgement:  Poor  Insight:  Shallow  Psychomotor Activity:  Psychomotor Retardation  Concentration:   Concentration: Fair and Attention Span: Fair  Recall:  Good  Fund of Knowledge:  Good  Language:  Good  Akathisia:  No  Handed:  Right  AIMS (if indicated):     Assets:  Communication Skills Housing Physical Health  ADL's:  Intact  Cognition:  WNL  Sleep:  Number of Hours: 8.15     Treatment Plan Summary: Daily contact with patient to assess and evaluate symptoms and progress in treatment and Medication management   Assessment:  HERVE HAUG is a 35 y.o. male with major depressive disorder and panic disorder admitted following a suicide attempt by hanging.  There is a history of alcohol use disorder recorded 2 years ago.  He was not intoxicated with alcohol at time of suicide attempt, amphetamines were detected on urine drug screen.     Diagnosis: Major depressive disorder, recurrent episode, severe (HCC)    Pertinent findings today:  Patient denies suicidal ideations. He is more forward thinking today on how to acquire prescriptions, and follow-up with [facility]. He feels that it is in his best interest to move on from [place], and learn to be happy on his own. He plans to call his father tonight. He notes that his mother and aunt have also both been supportive of him here, and he can name three additional friends that have been there for him.    Treatment Plan Summary: Daily contact with patient to assess and evaluate symptoms and progress in treatment.   Scheduled medications/labs:  1)MDD, recurrent, severe, without psychotic features- established problem, improving Continue - Paxil 20 mg daily - Abilify 5 mg daily  2) Panic disorder- established problem, stable - Paxil as above   PRN's : Vistaril 50 mg 3 times daily PRN  for anxiety Trazodone 100 mg as needed for sleep    Psychosocial:   1. Encouragement to attend group therapies. 2. Encourage patient to learn and implement coping strategies to avoid need for PRN medications. 3. Encouragement for medication  compliance. 4. Disposition in progress, appreciate social work assistance. 5. Conference call with Rolly Salter completed today     Develop treatment plan to be active in therapy to prevent further suicide attempts; decrease substance use upon discharge and the need for readmission.  Psycho-social education regarding self care.     Jesse Sans, MD 04/07/2020, 3:28 PM

## 2020-04-08 ENCOUNTER — Other Ambulatory Visit (HOSPITAL_COMMUNITY): Payer: Self-pay | Admitting: Behavioral Health

## 2020-04-08 MED ORDER — PAROXETINE HCL 20 MG PO TABS: 20.0000 mg | ORAL_TABLET | Freq: Every day | ORAL | 1 refills | Status: AC

## 2020-04-08 MED ORDER — HYDROXYZINE HCL 50 MG PO TABS: 50.0000 mg | ORAL_TABLET | Freq: Three times a day (TID) | ORAL | 1 refills | Status: AC | PRN

## 2020-04-08 MED ORDER — ARIPIPRAZOLE 5 MG PO TABS: 5.0000 mg | ORAL_TABLET | Freq: Every day | ORAL | 1 refills | Status: AC

## 2020-04-08 MED ORDER — TRAZODONE HCL 100 MG PO TABS: 100.0000 mg | ORAL_TABLET | Freq: Every evening | ORAL | 1 refills | Status: AC | PRN

## 2020-04-08 MED ORDER — PAROXETINE HCL 20 MG PO TABS: 20.0000 mg | ORAL_TABLET | Freq: Every day | ORAL | 0 refills | Status: DC

## 2020-04-08 NOTE — Progress Notes (Signed)
  [facility] Adult Case Management Discharge Plan :  Will you be returning to the same living situation after discharge:  Yes,  pt plans to return home. At discharge, do you have transportation home?: Yes,  CSW to arrange transportation home. Do you have the ability to pay for your medications: No.  Release of information consent forms completed and in the chart;  Patient's signature needed at discharge.  Patient to Follow up at:  Follow-up Information    [facility] Follow up.   Why: Appointment is scheduled for 04/09/2020 at 2:30PM.  Thanks! Contact information: [address] [place] [address] [phone]               Next level of care provider has access to [facility] Link:no  Safety Planning and Suicide Prevention discussed: Yes,  SPE completed with pt  Have you used any form of tobacco in the last 30 days? (Cigarettes, Smokeless Tobacco, Cigars, and/or Pipes): No  Has patient been referred to the Quitline?: Patient refused referral  Patient has been referred for addiction treatment: Pt. refused referral  [place] Smoker, LCSW 04/08/2020, 9:07 AM

## 2020-04-08 NOTE — Discharge Summary (Signed)
Physician Discharge Summary Note  Patient:  Edward Peters is an 35 y.o., male MRN:  [id number] DOB:  12/08/1985 Patient phone:  [phone] (home)  Patient address:   [address] [place] [id number],  Total Time spent with patient: 35 minutes- 25 minutes face-to-face contact with patient, 10 minutes documentation, coordination of care, scripts   Date of Admission:  04/03/2020 Date of Discharge: 04/08/2020  Reason for Admission:  35 year old male brought in by law enforcement after attempting to commit suicide via hanging  Principal Problem: Major depressive disorder, recurrent episode, severe (HCC) Discharge Diagnoses: Principal Problem:   Major depressive disorder, recurrent episode, severe (HCC) Active Problems:   Panic disorder   Suicide attempt by hanging (HCC)   Amphetamine abuse [facility](HCC)   Past Psychiatric History: He recently established care with [facility] and has peer support and therapist. He has an appointment with the psychiatrist on March 8th. He notes he has been on medication in the past, but cannot recall the name. He also states he has been at our hospital before. Per chart review, he has presented to our emergency room for acute intoxication and public belligerence in 2019. He was also admitted for 1 week in 2012. No suicide attempts prior to this hanging. Past legal charge for assault in 2015.   Past Medical History: History reviewed. No pertinent past medical history. History reviewed. No pertinent surgical history. Family History: History reviewed. No pertinent family history. Family Psychiatric  History: On previous reports he had said that he had multiple members of his family with mental health issues, and a cousin who committed suicide.  Social History:  Social History   Substance and Sexual Activity  Alcohol Use Yes   Comment: occassional monthly     Social History   Substance and Sexual Activity  Drug Use Never    Social History   Socioeconomic  History  . Marital status: Single    Spouse name: Not on file  . Number of children: Not on file  . Years of education: Not on file  . Highest education level: Not on file  Occupational History  . Not on file  Tobacco Use  . Smoking status: Never Smoker  . Smokeless tobacco: Current User  Vaping Use  . Vaping Use: Never used  Substance and Sexual Activity  . Alcohol use: Yes    Comment: occassional monthly  . Drug use: Never  . Sexual activity: Yes  Other Topics Concern  . Not on file  Social History Narrative  . Not on file   Social Determinants of Health   Financial Resource Strain: Not on file  Food Insecurity: Not on file  Transportation Needs: Not on file  Physical Activity: Not on file  Stress: Not on file  Social Connections: Not on file    Hospital Course:  35 year old male brought in by Patent examinerlaw enforcement after attempting to commit suicide via hanging. While he was here he was continued on Paxil 20 mg daily, and started on Abilify 5 mg daily for augmentation of depression. He was also given hydroxyzine PRN for anxiety, and trazodone PRN for sleep. He attended groups, and felt that some of them were quite helpful. He discussed that his relationship with [name] had some toxic communication qualities. He notes that he still wanted to be in his sons life, but realized that the relationship with [name] may not have been good for him. Prior to discharge we were able to complete a conference call with [name],  and establish clearly that this relationship was over, and she would not be returning to the home. Visitation with his son would still be a possibility pending outcome of CPS investigation. Patient took this conversation well. He was also able to speak to his father, aunt, and mother during hospital stay. While on the unit, his mood gradually improved each day, and he became more forward thinking. At time of discharge he noted that he has too many horses to care for alone without  [facility] His plan at discharge is to clean a few up with his aunt to sell this upcoming weekend so that his daily tasks are more manageable. He notes he also has the option to stay with his father if being alone is too overwhelming. Discussed crisis management of going to fathers if depression emerges, and calling 911 vs. Mobile crisis if suicidal thoughts return. He has already established care with [facility], and has a follow-up appointment tomorrow. He denies suicidal ideations, homicidal ideations, visual hallucinations, and auditory hallucinations.   Physical Findings: AIMS: Facial and Oral Movements Muscles of Facial Expression: None, normal Lips and Perioral Area: None, normal Jaw: None, normal Tongue: None, normal,Extremity Movements Upper (arms, wrists, hands, fingers): None, normal Lower (legs, knees, ankles, toes): None, normal, Trunk Movements Neck, shoulders, hips: None, normal, Overall Severity Severity of abnormal movements (highest score from questions above): None, normal Incapacitation due to abnormal movements: None, normal Patient's awareness of abnormal movements (rate only patient's report): No Awareness, Dental Status Current problems with teeth and/or dentures?: No Does patient usually wear dentures?: No  CIWA:    COWS:     Musculoskeletal: Strength & Muscle Tone: within normal limits Gait & Station: normal Patient leans: N/A  Psychiatric Specialty Exam: Physical Exam Vitals and nursing note reviewed.  Constitutional:      Appearance: Normal appearance.  HENT:     Head: Normocephalic and atraumatic.     Right Ear: External ear normal.     Left Ear: External ear normal.     Nose: Nose normal.     Mouth/Throat:     Mouth: Mucous membranes are moist.     Pharynx: Oropharynx is clear.  Eyes:     Extraocular Movements: Extraocular movements intact.     Conjunctiva/sclera: Conjunctivae normal.     Pupils: Pupils are equal, round, and reactive to light.   Cardiovascular:     Rate and Rhythm: Normal rate.     Pulses: Normal pulses.  Pulmonary:     Effort: Pulmonary effort is normal.     Breath sounds: Normal breath sounds.  Abdominal:     General: Abdomen is flat.     Palpations: Abdomen is soft.  Musculoskeletal:        General: No swelling. Normal range of motion.     Cervical back: Normal range of motion and neck supple.  Skin:    General: Skin is warm and dry.  Neurological:     General: No focal deficit present.     Mental Status: He is alert and oriented to person, place, and time.  Psychiatric:        Mood and Affect: Mood normal.        Behavior: Behavior normal.        Thought Content: Thought content normal.        Judgment: Judgment normal.     Review of Systems  Constitutional: Negative for appetite change and fatigue.  HENT: Negative for rhinorrhea and sore throat.   Eyes: Negative  for photophobia and visual disturbance.  Respiratory: Negative for cough and shortness of breath.   Cardiovascular: Negative for chest pain and palpitations.  Gastrointestinal: Negative for constipation, diarrhea, nausea and vomiting.  Endocrine: Negative for cold intolerance and heat intolerance.  Genitourinary: Negative for difficulty urinating and dysuria.  Musculoskeletal: Negative for arthralgias and myalgias.  Skin: Negative for rash and wound.  Allergic/Immunologic: Negative for environmental allergies and food allergies.  Neurological: Negative for dizziness and headaches.  Hematological: Negative for adenopathy. Does not bruise/bleed easily.  Psychiatric/Behavioral: Negative for behavioral problems, hallucinations and suicidal ideas.    Blood pressure 110/73, pulse 85, temperature 97.7 F (36.5 C), temperature source Oral, resp. rate 18, height 6' (1.829 m), weight 75.8 kg, SpO2 98 %.Body mass index is 22.65 kg/m.  General Appearance: Well Groomed  Patent attorney::  Good  Speech:  Clear and Coherent and Normal Rate  Volume:   Normal  Mood:  Euthymic  Affect:  Congruent  Thought Process:  Coherent and Linear  Orientation:  Full (Time, Place, and Person)  Thought Content:  Logical  Suicidal Thoughts:  No  Homicidal Thoughts:  No  Memory:  Immediate;   Fair Recent;   Fair Remote;   Fair  Judgement:  Intact  Insight:  Fair  Psychomotor Activity:  Normal  Concentration:  Fair  Recall:  [employer] of Knowledge:Fair  Language: Fair  Akathisia:  Negative  Handed:  Right  AIMS (if indicated):     Assets:  Communication Skills Desire for Improvement Housing Physical Health Resilience Social Support Talents/Skills Transportation  Sleep:  Number of Hours: 8.5  Cognition: WNL  ADL's:  Intact          Have you used any form of tobacco in the last 30 days? (Cigarettes, Smokeless Tobacco, Cigars, and/or Pipes): No  Has this patient used any form of tobacco in the last 30 days? (Cigarettes, Smokeless Tobacco, Cigars, and/or Pipes) No  Blood Alcohol level:  Lab Results  Component Value Date   ETH <10 04/02/2020   ETH 85 (H) 11/09/2017    Metabolic Disorder Labs:  No results found for: HGBA1C, MPG No results found for: PROLACTIN No results found for: CHOL, TRIG, HDL, CHOLHDL, VLDL, LDLCALC  See Psychiatric Specialty Exam and Suicide Risk Assessment completed by Attending Physician prior to discharge.  Discharge destination:  Home  Is patient on multiple antipsychotic therapies at discharge:  No   Has Patient had three or more failed trials of antipsychotic monotherapy by history:  No  Recommended Plan for Multiple Antipsychotic Therapies: NA  Discharge Instructions    Diet general   Complete by: As directed    Increase activity slowly   Complete by: As directed      Allergies as of 04/08/2020   No Known Allergies     Medication List    TAKE these medications     Indication  ARIPiprazole 5 MG tablet Commonly known as: ABILIFY Take 1 tablet (5 mg total) by mouth daily.   Indication: Major Depressive Disorder   hydrOXYzine 50 MG tablet Commonly known as: ATARAX/VISTARIL Take 1 tablet (50 mg total) by mouth 3 (three) times daily as needed for anxiety.  Indication: Feeling Anxious   PARoxetine 20 MG tablet Commonly known as: PAXIL Take 1 tablet (20 mg total) by mouth daily.  Indication: Generalized Anxiety Disorder, Major Depressive Disorder   traZODone 100 MG tablet Commonly known as: DESYREL Take 1 tablet (100 mg total) by mouth at bedtime as needed for sleep.  Indication: Trouble Sleeping       Follow-up Information    [facility] Follow up.   Why: Appointment is scheduled for 04/09/2020 at 2:30PM.  Thanks! Contact information: [address] [place] [address] [phone]               Follow-up recommendations:  Activity:  as tolerated Diet:  regular diet  Comments:  7-day supply of free medications provided to patient at discharge along with printed 30-day scripts with one refill.   Signed: Jesse Sans, MD 04/08/2020, 9:33 AM

## 2020-04-08 NOTE — Progress Notes (Signed)
Recreation Therapy Notes  INPATIENT RECREATION TR PLAN  Patient Details Name: Edward Peters MRN: [id number] DOB: 07-28-1985 Today's Date: 04/08/2020  Rec Therapy Plan Is patient appropriate for Therapeutic Recreation?: Yes Treatment times per week: at least 3 Estimated Length of Stay: 5-7 days TR Treatment/Interventions: Group participation (Comment)  Discharge Criteria Pt will be discharged from therapy if:: Discharged Treatment plan/goals/alternatives discussed and agreed upon by:: Patient/family  Discharge Summary Short term goals set: Patient will identify 3 positive coping skills strategies to use post d/c within 5 recreation therapy group sessions Short term goals met: Adequate for discharge Progress toward goals comments: Groups attended Which groups?: Other (Comment) (Relaxation, Happiness) Reason goals not met: N/A Therapeutic equipment acquired: N/A Reason patient discharged from therapy: Discharge from hospital Pt/family agrees with progress & goals achieved: Yes Date patient discharged from therapy: 04/08/20   Edward Peters 04/08/2020, 12:47 PM

## 2020-04-08 NOTE — Plan of Care (Signed)
  Problem: Coping Skills Goal: STG - Patient will identify 3 positive coping skills strategies to use post d/c within 5 recreation therapy group sessions Description: STG - Patient will identify 3 positive coping skills strategies to use post d/c within 5 recreation therapy group sessions 04/08/2020 1245 by Alveria Apley, LRT Outcome: Adequate for Discharge 04/08/2020 1245 by Alveria Apley, LRT Outcome: Adequate for Discharge

## 2020-04-08 NOTE — Progress Notes (Signed)
Recreation Therapy Notes  Date: 04/08/2020  Time: 9:30 am   Location: Craft room   Behavioral response: Appropriate  Intervention Topic: Relaxation   Discussion/Intervention:  Group content today was focused on relaxation. The group defined relaxation and identified healthy ways to relax. Individuals expressed how much time they spend relaxing. Patients expressed how much their life would be if they did not make time for themselves to relax. The group stated ways they could improve their relaxation techniques in the future.  Individuals participated in the intervention "Time to Relax" where they had a chance to experience different relaxation techniques.  Clinical Observations/Feedback: Patient came to group and expressed that people sometimes keep him from relaxing. Individual was social with staff and peers while participating in the intervention. Lesslie Mossa LRT/CTRS          Morghan Kester 04/08/2020 12:00 PM

## 2020-04-08 NOTE — [facility] Suicide Risk Assessment (Signed)
[facility] Discharge Suicide Risk Assessment   Principal Problem: Major depressive disorder, recurrent episode, severe (HCC) Discharge Diagnoses: Principal Problem:   Major depressive disorder, recurrent episode, severe (HCC) Active Problems:   Panic disorder   Suicide attempt by hanging (HCC)   Amphetamine abuse (HCC)   Total Time spent with patient: 35 minutes- 25 minutes face-to-face contact with patient, 10 minutes documentation, coordination of care, scripts   Musculoskeletal: Strength & Muscle Tone: within normal limits Gait & Station: normal Patient leans: N/A  Psychiatric Specialty Exam: Review of Systems  Constitutional: Negative for appetite change and fatigue.  HENT: Negative for rhinorrhea and sore throat.   Eyes: Negative for photophobia and visual disturbance.  Respiratory: Negative for cough and shortness of breath.   Cardiovascular: Negative for chest pain and palpitations.  Gastrointestinal: Negative for constipation, diarrhea, nausea and vomiting.  Endocrine: Negative for cold intolerance and heat intolerance.  Genitourinary: Negative for difficulty urinating and dysuria.  Musculoskeletal: Negative for arthralgias and myalgias.  Skin: Negative for rash and wound.  Allergic/Immunologic: Negative for environmental allergies and food allergies.  Neurological: Negative for dizziness and headaches.  Hematological: Negative for adenopathy. Does not bruise/bleed easily.  Psychiatric/Behavioral: Negative for behavioral problems, hallucinations and suicidal ideas.    Blood pressure 110/73, pulse 85, temperature 97.7 F (36.5 C), temperature source Oral, resp. rate 18, height 6' (1.829 m), weight 75.8 kg, SpO2 98 %.Body mass index is 22.65 kg/m.  General Appearance: Well Groomed  Patent attorney::  Good  Speech:  Clear and Coherent and Normal Rate  Volume:  Normal  Mood:  Euthymic  Affect:  Congruent  Thought Process:  Coherent and Linear  Orientation:  Full (Time, Place,  and Person)  Thought Content:  Logical  Suicidal Thoughts:  No  Homicidal Thoughts:  No  Memory:  Immediate;   Fair Recent;   Fair Remote;   Fair  Judgement:  Intact  Insight:  Fair  Psychomotor Activity:  Normal  Concentration:  Fair  Recall:  [employer] of Knowledge:Fair  Language: Fair  Akathisia:  Negative  Handed:  Right  AIMS (if indicated):     Assets:  Communication Skills Desire for Improvement Housing Physical Health Resilience Social Support Talents/Skills Transportation  Sleep:  Number of Hours: 8.5  Cognition: WNL  ADL's:  Intact   Mental Status Per Nursing Assessment::   On Admission:  NA  Demographic Factors:  Male, Divorced or widowed, Caucasian and Living alone  Loss Factors: Loss of significant relationship  Historical Factors: Prior suicide attempts  Risk Reduction Factors:   Responsible for children under 58 years of age, Sense of responsibility to family, Employed, Positive social support, Positive therapeutic relationship and Positive coping skills or problem solving skills  Continued Clinical Symptoms:  Depression:   Recent sense of peace/wellbeing Previous Psychiatric Diagnoses and Treatments  Cognitive Features That Contribute To Risk:  None    Suicide Risk:  Mild:  Suicidal ideation of limited frequency, intensity, duration, and specificity.  There are no identifiable plans, no associated intent, mild dysphoria and related symptoms, good self-control (both objective and subjective assessment), few other risk factors, and identifiable protective factors, including available and accessible social support.   Follow-up Information    [facility] Follow up.   Why: Appointment is scheduled for 04/09/2020 at 2:30PM.  Thanks! Contact information: [address] [place] [address] [phone]               Plan Of Care/Follow-up recommendations:  Activity:  as tolerated Diet:  regular diet  Jesse Sans, MD 04/08/2020, 9:24 AM

## 2020-04-08 NOTE — Progress Notes (Signed)
Pt was educated on prescriptions and follow up care. Pt questions were answered and pt verbalized understanding and did not voice any concerns. Pt's belongings were returned. Pt was not in any distress at time of discharge. He was safely discharged to the [facility].

## 2020-04-08 NOTE — [facility] Group Notes (Signed)
[facility] Group Notes:  (Nursing/MHT/Case Management/Adjunct)  Date:  04/08/2020  Time:  9:11 AM  Type of Therapy:  Community Meeting  Participation Level:  Did Not Attend   Lynelle Smoke [facility] 04/08/2020, 9:11 AM

## 2020-05-28 ENCOUNTER — Other Ambulatory Visit: Payer: Self-pay

## 2020-05-28 ENCOUNTER — Emergency Department
Admission: EM | Admit: 2020-05-28 | Discharge: 2020-05-28 | Disposition: A | Discharge status: Home / Self Care | Payer: Self-pay | Attending: Emergency Medicine | Admitting: Emergency Medicine

## 2020-05-28 ENCOUNTER — Encounter: Payer: Self-pay | Admitting: Emergency Medicine

## 2020-05-28 DIAGNOSIS — Z0000 Encounter for general adult medical examination without abnormal findings: Secondary | ICD-10-CM | POA: Insufficient documentation

## 2020-05-28 DIAGNOSIS — Z046 Encounter for general psychiatric examination, requested by authority: Secondary | ICD-10-CM | POA: Insufficient documentation

## 2020-05-28 DIAGNOSIS — Z139 Encounter for screening, unspecified: Secondary | ICD-10-CM

## 2020-05-28 DIAGNOSIS — F332 Major depressive disorder, recurrent severe without psychotic features: Secondary | ICD-10-CM | POA: Diagnosis not present

## 2020-05-28 DIAGNOSIS — Z79899 Other long term (current) drug therapy: Secondary | ICD-10-CM | POA: Insufficient documentation

## 2020-05-28 NOTE — ED Notes (Signed)
Personal Belongings:  Nurse, learning disability Black boots Black socks Blue boxers Money [phone] Debit card keys

## 2020-05-28 NOTE — ED Provider Notes (Signed)
[facility] Emergency Department Provider Note ____________________________________________   Event Date/Time   First MD Initiated Contact with Patient 05/28/20 1709     (approximate)  I have reviewed the triage vital signs and the nursing notes.  HISTORY  Chief Complaint Psychiatric Evaluation   HPI Edward Peters is a 35 y.o. malewho presents to the ED for evaluation of his mental health under IVC.  Chart review indicates history of depression and polysubstance abuse.  Patient presents to the ED under IVC from [facility].  IVC papers were pulled by patient's ex-wife due to concern for suicidality.  Patient is already seen by psychiatry prior to my evaluation, who rescinded IVC and indicated they were comfortable for outpatient management.  Patient reports frustration with the social situation at home, particularly his ex-wife and 87-month-old child.  He reports remarkable difficulty getting access to see his 64-month-old child, due to his ex-wife.  He indicates that she frequently lies and is manipulative.  He reports exasperation of frustration with the situation, and reports expressing this to her today because of his inability to see his child.  He reports telling her "I cannot take this anymore" and he reports concern that she took this as suicidality.  He reports that he has never had suicidal thoughts and certainly does not have that now.  Denies any thoughts of self-harm or intent to do so.  Denies any recent possums abuse and reports that he has been clean.  He reports no recent medical illnesses, fevers.  He reports he has been tolerating p.o. intake and toileting at his baseline.  He reports today is his father's birthday and is eager to have dinner with his father.   History reviewed. No pertinent past medical history.  Patient Active Problem List   Diagnosis Date Noted  . 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 Major depressive disorder, recurrent  episode, severe (HCC) 04/03/2020  . Severe recurrent major depression without psychotic features (HCC) 04/02/2020  . Suicide attempt by hanging (HCC) 04/02/2020  . Alcohol abuse 11/10/2017  . Substance induced mood disorder (HCC) 11/10/2017  . Moderate major depression, single episode (HCC) 11/10/2017  . Panic disorder 11/10/2017    History reviewed. No pertinent surgical history.  Prior to Admission medications   Medication Sig Start Date End Date Taking? Authorizing Provider  ARIPiprazole (ABILIFY) 5 MG tablet Take 1 tablet (5 mg total) by mouth daily. 04/08/20   Jesse Sans, MD  hydrOXYzine (ATARAX/VISTARIL) 50 MG tablet Take 1 tablet (50 mg total) by mouth 3 (three) times daily as needed for anxiety. 04/08/20   Jesse Sans, MD  PARoxetine (PAXIL) 20 MG tablet Take 1 tablet (20 mg total) by mouth daily. 04/08/20 06/07/20  Jesse Sans, MD  traZODone (DESYREL) 100 MG tablet Take 1 tablet (100 mg total) by mouth at bedtime as needed for sleep. 04/08/20   Jesse Sans, MD    Allergies Patient has no known allergies.  History reviewed. No pertinent family history.  Social History Social History   Tobacco Use  . Smoking status: Never Smoker  . Smokeless tobacco: Current User  Vaping Use  . Vaping Use: Never used  Substance Use Topics  . Alcohol use: Yes    Comment: occassional monthly  . Drug use: Never    Review of Systems  Constitutional: No fever/chills Eyes: No visual changes. ENT: No sore throat. Cardiovascular: Denies chest pain. Respiratory: Denies shortness of breath. Gastrointestinal: No abdominal pain.  No nausea, no  vomiting.  No diarrhea.  No constipation. Genitourinary: Negative for dysuria. Musculoskeletal: Negative for back pain. Skin: Negative for rash. Neurological: Negative for headaches, focal weakness or numbness.  ____________________________________________   PHYSICAL EXAM:  VITAL SIGNS: Vitals:   05/28/20 1710 05/28/20 1824  BP:  (!) 138/94 (!) 131/100  Pulse: (!) 111 83  Resp: 18 19  Temp: 98.1 F (36.7 C) 98.4 F (36.9 C)  SpO2: 100% 98%     Constitutional: Alert and oriented. Well appearing and in no acute distress. Eyes: Conjunctivae are normal. PERRL. EOMI. Head: Atraumatic. Nose: No congestion/rhinnorhea. Mouth/Throat: Mucous membranes are moist.  Oropharynx non-erythematous. Neck: No stridor. No cervical spine tenderness to palpation. Cardiovascular: Normal rate, regular rhythm. Grossly normal heart sounds.  Good peripheral circulation. Respiratory: Normal respiratory effort.  No retractions. Lungs CTAB. Gastrointestinal: Soft , nondistended, nontender to palpation. No CVA tenderness. Musculoskeletal: No lower extremity tenderness nor edema.  No joint effusions. No signs of acute trauma. Neurologic:  Normal speech and language. No gross focal neurologic deficits are appreciated. No gait instability noted. Skin:  Skin is warm, dry and intact. No rash noted. Psychiatric: Mood and affect are normal. Speech and behavior are normal. ____________________________________________  PROCEDURES and INTERVENTIONS  Procedure(s) performed (including Critical Care):  Procedures  Medications - No data to display  ____________________________________________   MDM / ED COURSE  35 year old male presents to the ED under IVC due to ex-wife concerns for suicidality, without evidence of such, and amenable to outpatient management.  Patient resents tachycardic while frustrated, and a self resolves, otherwise normal vital signs.  Exam reassuring without evidence of any acute derangements.  He is pleasant, with linear thought processes and I see no evidence of acute pathology.  I see no evidence of psychiatric emergency, suicidality or indication to maintain IVC, and I agree with psychiatry's recommendation to discontinue this.  I see no evidence of medical pathology typically outpatient management, or to warrant further  work-up at this time.  We discussed return precautions for the ED.   Clinical Course as of 05/28/20 1846  Wed May 28, 2020  1845 Repeat vitals reassuring.  Patient is calm down.  I see no indications for further medical work-up, blood work.  No evidence of pathology to preclude outpatient management.  We discussed return precautions for the ED. [DS]    Clinical Course User Index [DS] Delton Prairie, MD    ____________________________________________   FINAL CLINICAL IMPRESSION(S) / ED DIAGNOSES  Final diagnoses:  Encounter for medical screening examination     ED Discharge Orders    None       Masayo Fera Katrinka Blazing   Note:  This document was prepared using Dragon voice recognition software and may include unintentional dictation errors.   Delton Prairie, MD 05/28/20 [phone]

## 2020-05-28 NOTE — ED Triage Notes (Signed)
Pt comes into the ED via [employer] under IVC from [facility].  Per the patient he told his ex "I cant take it anymore" and that is who called and started the process.  PT denies any SI or HI.  Pt cooperative at this time and in NAD.  Pt does admit he has had a previous SI attempt in the past.

## 2020-05-28 NOTE — ED Notes (Signed)
IVC, rescinded by Dr. Toni Amend

## 2020-05-28 NOTE — Consult Note (Signed)
[facility] Face-to-Face Psychiatry Consult   Reason for Consult: Consult for 35 year old man with a history of depression brought in under IVC papers filed at [facility] alleging suicidal ideation Referring Physician: Katrinka Blazing Patient Identification: Edward Peters MRN:  [id number] Principal Diagnosis: Major depressive disorder, recurrent episode, severe (HCC) Diagnosis:  Principal Problem:   Major depressive disorder, recurrent episode, severe (HCC)   Total Time spent with patient: 1 hour  Subjective:   Edward Peters is a 35 y.o. male patient admitted with "I did not mean I wanted to kill myself.  HPI: Patient seen chart reviewed.  Patient known from previous encounters.  35 year old man sent here from [facility].  He was taken in there after he had a conversation with his wife or ex-wife today in which he admits that he said that he "could not take it anymore".  Patient says he has continued to have difficulty dealing with his wife over their mutual child.  He says she is not letting him see the child and he is becoming increasingly frustrated.  He admits having made the comment about not being able to take it anymore but says that that did not mean he was planning to kill himself.  He denies having any suicidal thoughts intent or plan.  He says he has been compliant with the medication he was given at discharge and has been compliant with treatment at [facility].  Denies use of any drugs since discharge.  Denies homicidal ideation.  Patient appears to be lucid and calm in his conversation.  No threatening or agitated or disorganized behavior.  No evidence of attempts at self-harm.  Past Psychiatric History: Patient was in the hospital just a month or so ago because of a suicide attempt by hanging with major depression.  Improved while in the hospital.  History also of some amphetamine abuse  Risk to Self:   Risk to Others:   Prior Inpatient Therapy:   Prior Outpatient Therapy:    Past Medical History: History  reviewed. No pertinent past medical history. History reviewed. No pertinent surgical history. Family History: History reviewed. No pertinent family history. Family Psychiatric  History: See previous Social History:  Social History   Substance and Sexual Activity  Alcohol Use Yes   Comment: occassional monthly     Social History   Substance and Sexual Activity  Drug Use Never    Social History   Socioeconomic History  . Marital status: Single    Spouse name: Not on file  . Number of children: Not on file  . Years of education: Not on file  . Highest education level: Not on file  Occupational History  . Not on file  Tobacco Use  . Smoking status: Never Smoker  . Smokeless tobacco: Current User  Vaping Use  . Vaping Use: Never used  Substance and Sexual Activity  . Alcohol use: Yes    Comment: occassional monthly  . Drug use: Never  . Sexual activity: Yes  Other Topics Concern  . Not on file  Social History Narrative  . Not on file   Social Determinants of Health   Financial Resource Strain: Not on file  Food Insecurity: Not on file  Transportation Needs: Not on file  Physical Activity: Not on file  Stress: Not on file  Social Connections: Not on file   Additional Social History:    Allergies:  No Known Allergies  Labs: No results found for this or any previous visit (from the past 48 hour(s)).  No current facility-administered medications for this encounter.   Current Outpatient Medications  Medication Sig Dispense Refill  . ARIPiprazole (ABILIFY) 5 MG tablet Take 1 tablet (5 mg total) by mouth daily. 30 tablet 1  . hydrOXYzine (ATARAX/VISTARIL) 50 MG tablet Take 1 tablet (50 mg total) by mouth 3 (three) times daily as needed for anxiety. 90 tablet 1  . PARoxetine (PAXIL) 20 MG tablet Take 1 tablet (20 mg total) by mouth daily. 30 tablet 1  . traZODone (DESYREL) 100 MG tablet Take 1 tablet (100 mg total) by mouth at bedtime as needed for sleep. 30 tablet 1     Musculoskeletal: Strength & Muscle Tone: within normal limits Gait & Station: normal Patient leans: N/A            Psychiatric Specialty Exam:  Presentation  General Appearance: No data recorded Eye Contact:No data recorded Speech:No data recorded Speech Volume:No data recorded Handedness:No data recorded  Mood and Affect  Mood:No data recorded Affect:No data recorded  Thought Process  Thought Processes:No data recorded Descriptions of Associations:No data recorded Orientation:No data recorded Thought Content:No data recorded History of Schizophrenia/Schizoaffective disorder:No  Duration of Psychotic Symptoms:No data recorded Hallucinations:No data recorded Ideas of Reference:No data recorded Suicidal Thoughts:No data recorded Homicidal Thoughts:No data recorded  Sensorium  Memory:No data recorded Judgment:No data recorded Insight:No data recorded  Executive Functions  Concentration:No data recorded Attention Span:No data recorded Recall:No data recorded Fund of Knowledge:No data recorded Language:No data recorded  Psychomotor Activity  Psychomotor Activity:No data recorded  Assets  Assets:No data recorded  Sleep  Sleep:No data recorded  Physical Exam: Physical Exam Vitals and nursing note reviewed.  Constitutional:      Appearance: Normal appearance.  HENT:     Head: Normocephalic and atraumatic.     Mouth/Throat:     Pharynx: Oropharynx is clear.  Eyes:     Pupils: Pupils are equal, round, and reactive to light.  Cardiovascular:     Rate and Rhythm: Normal rate and regular rhythm.  Pulmonary:     Effort: Pulmonary effort is normal.     Breath sounds: Normal breath sounds.  Abdominal:     General: Abdomen is flat.     Palpations: Abdomen is soft.  Musculoskeletal:        General: Normal range of motion.  Skin:    General: Skin is warm and dry.  Neurological:     General: No focal deficit present.     Mental Status: He is  alert. Mental status is at baseline.  Psychiatric:        Attention and Perception: Attention normal.        Mood and Affect: Mood is anxious.        Speech: Speech normal.        Behavior: Behavior is cooperative.        Thought Content: Thought content normal.        Cognition and Memory: Cognition normal.        Judgment: Judgment normal.    Review of Systems  Constitutional: Negative.   HENT: Negative.   Eyes: Negative.   Respiratory: Negative.   Cardiovascular: Negative.   Gastrointestinal: Negative.   Musculoskeletal: Negative.   Skin: Negative.   Neurological: Negative.   Psychiatric/Behavioral: Negative for depression, hallucinations, memory loss, substance abuse and suicidal ideas. The patient is nervous/anxious. The patient does not have insomnia.    Blood pressure (!) 138/94, pulse (!) 111, temperature 98.1 F (36.7 C), temperature source Oral, resp. rate  18, height 6' (1.829 m), weight 72.6 kg, SpO2 100 %. Body mass index is 21.7 kg/m.  Treatment Plan Summary: Plan Spoke with patient who is very consistent in denying suicidal ideation.  With his consent spoke to his mother who reports that she had not known of any suicidal statements and had not thought that the patient was having any worsening of his mood symptoms.  Mother feels like it would be safe for him to be discharged home.  Patient promises that if suicidal thoughts return he will immediately notify somebody.  Reminded to stop any alcohol or drug use and to make sure he is staying on his medicine and following up with outpatient treatment.  Case reviewed with emergency room physician.  Discontinued IVC.  Patient can be discharged from the emergency room to outpatient treatment.  Disposition: No evidence of imminent risk to self or others at present.   Patient does not meet criteria for psychiatric inpatient admission. Supportive therapy provided about ongoing stressors. Discussed crisis plan, support from social  network, calling 911, coming to the Emergency Department, and calling Suicide Hotline.  Mordecai Rasmussen, MD 05/28/2020 6:00 PM

## 2022-08-09 IMAGING — CT CT ANGIO HEAD
3 of 10 series · 15 of 47 positions shown · IV contrast (omnipaque)
Comparison: Head CT 05/25/2005.

CLINICAL DATA: Dizziness, nonspecific.  Attempted hanging.

EXAM:
CT ANGIOGRAPHY HEAD AND NECK
TECHNIQUE: Multidetector CT imaging of the head and neck was performed using
the standard protocol during bolus administration of intravenous
contrast. Multiplanar CT image reconstructions and MIPs were
obtained to evaluate the vascular anatomy. Carotid stenosis
measurements (when applicable) are obtained utilizing NASCET
criteria, using the distal internal carotid diameter as the
denominator.
CONTRAST:  75mL OMNIPAQUE IOHEXOL 350 MG/ML SOLN

[Series 10: ax thin · axial · 0.60mm/px · z∈[-10,+330]mm · 9 of 406 slices shown]
[im 26/406  brain]
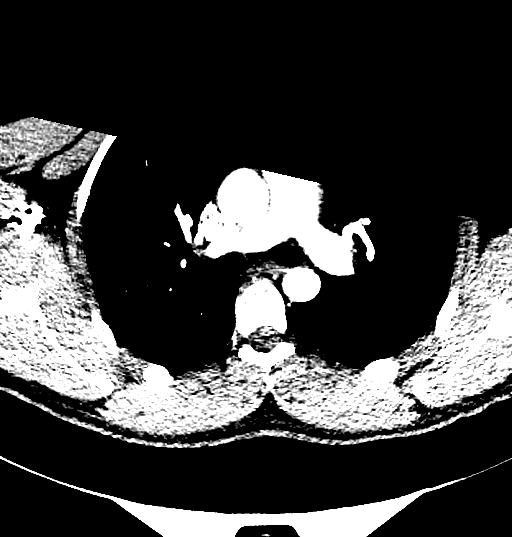
[im 76/406  bone]
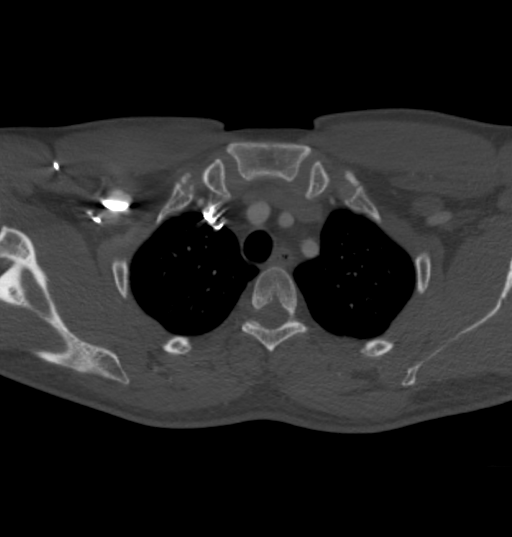
[im 127/406  brain]
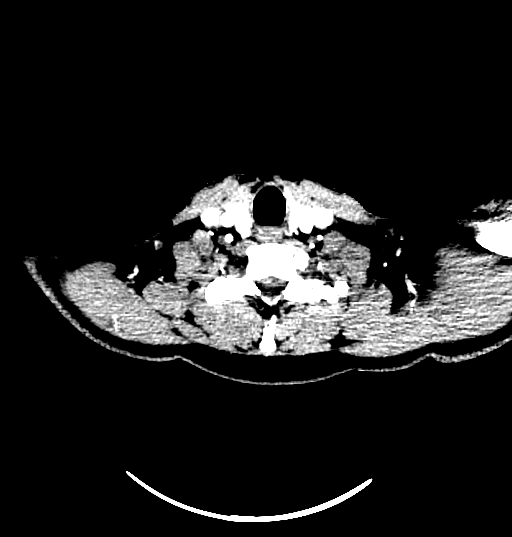
[im 152/406  bone]
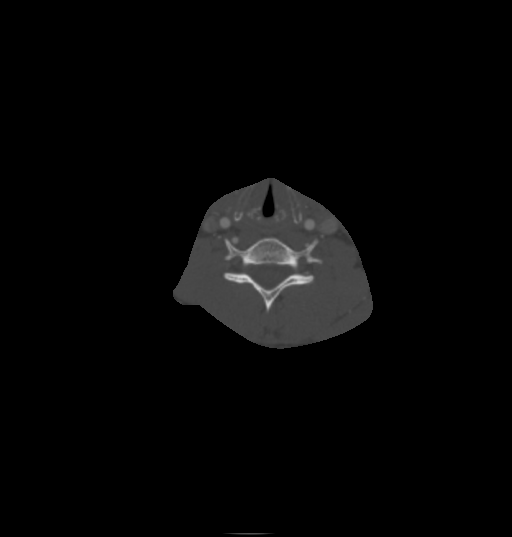
[im 203/406  brain]
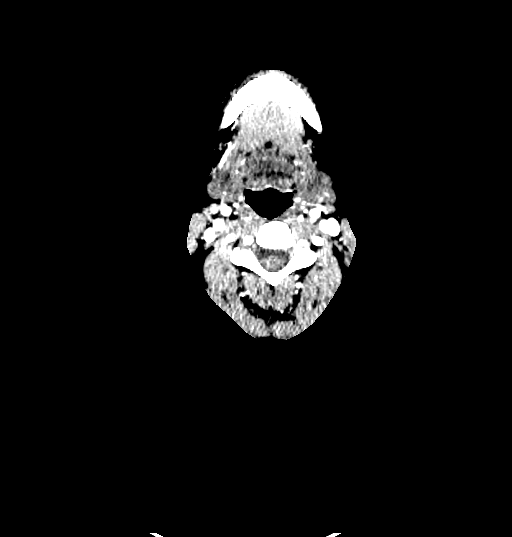
[im 254/406  bone]
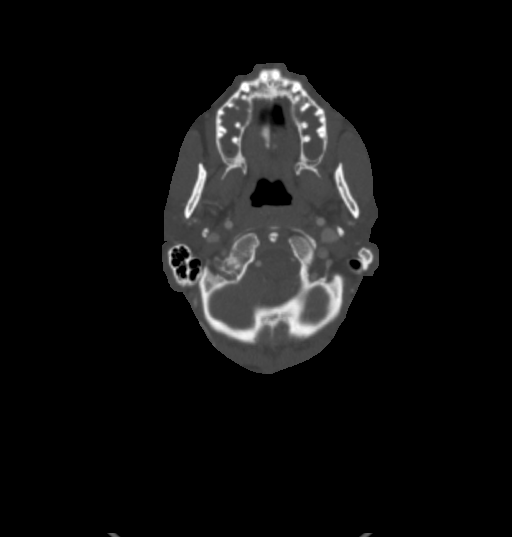
[im 279/406  brain]
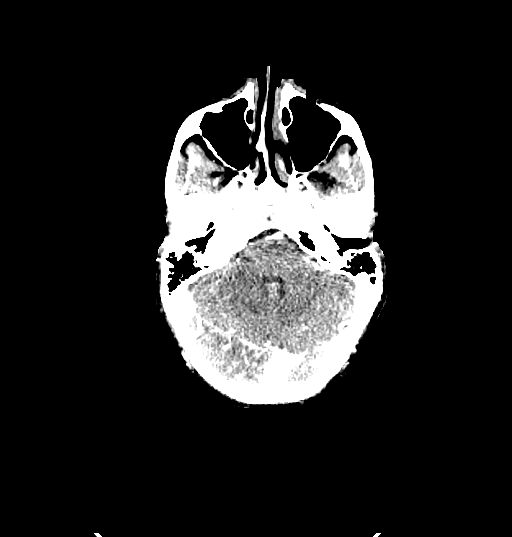
[im 330/406  bone]
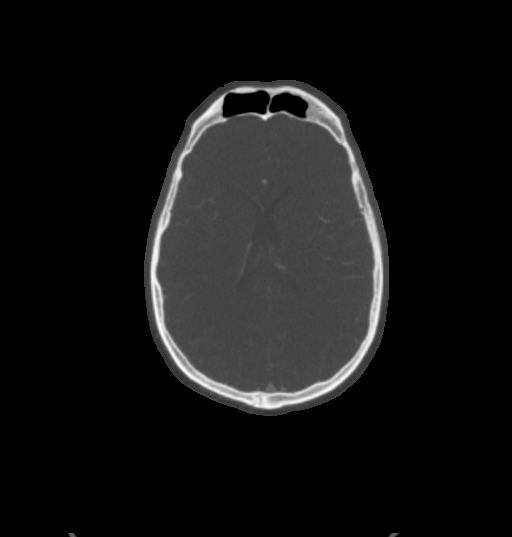
[im 380/406  brain]
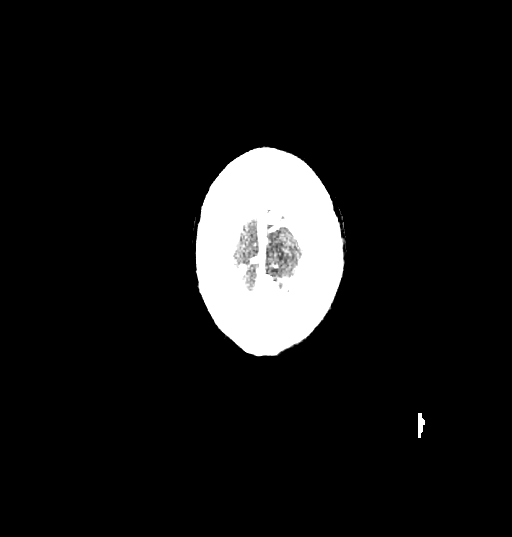

[Series 11: coronal thin · coronal · 0.60mm/px · 3 of 257 slices shown]
[im 86/257  brain]
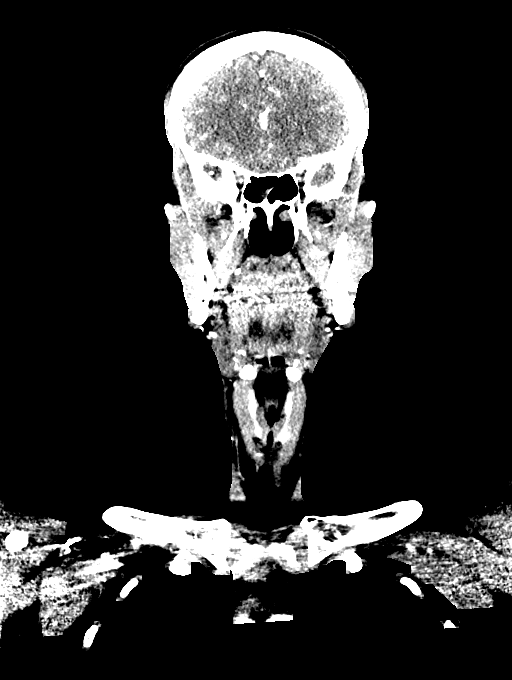
[im 129/257  brain]
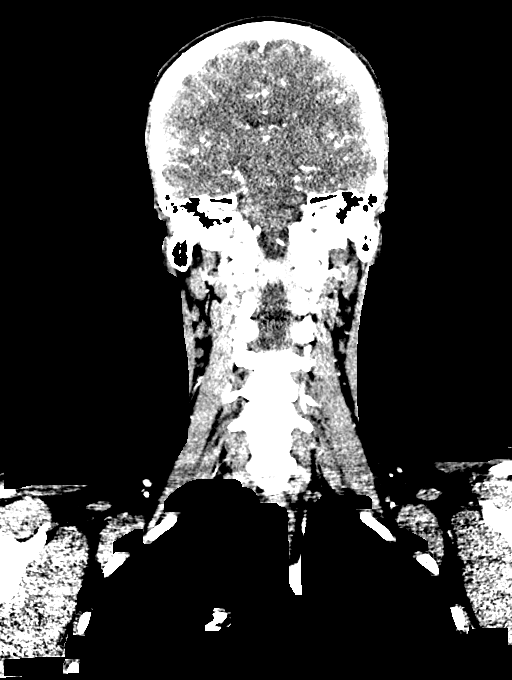
[im 171/257  brain]
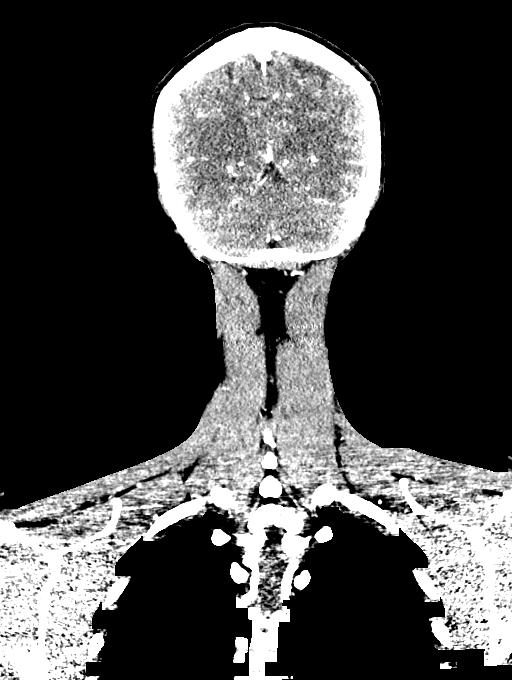

[Series 12: sagittal thin · sagittal · 0.62mm/px · 3 of 287 slices shown]
[im 184/287  brain]
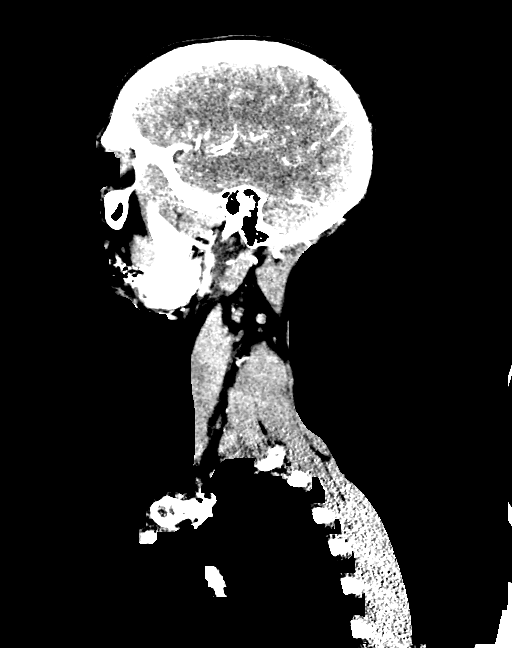
[im 218/287  brain]
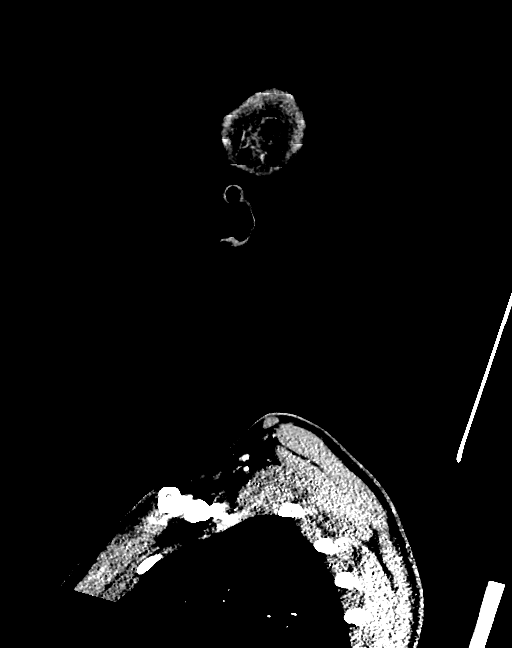
[im 252/287  brain]
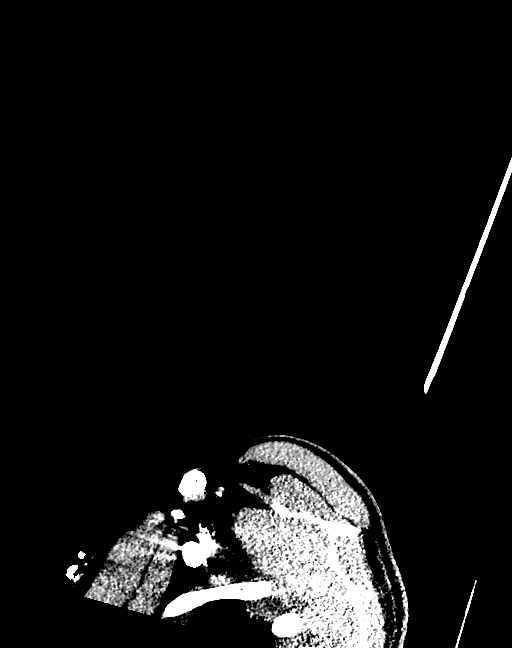

[15 of 47 positions shown; findings below may reference images not displayed]

FINDINGS: CT HEAD FINDINGS

Brain:

Cerebral volume is normal.

There is no acute intracranial hemorrhage.

No demarcated cortical infarct.

No extra-axial fluid collection.

No evidence of intracranial mass.

No midline shift.

Vascular: No hyperdense vessel.

Skull: Normal. Negative for fracture or focal lesion.

Sinuses: Mild bilateral ethmoid and maxillary sinus mucosal
thickening. Moderate-sized right maxillary sinus mucous retention
cyst.

Orbits: No mass or acute finding.

Review of the MIP images confirms the above findings

CTA NECK FINDINGS

Aortic arch: Standard aortic branching. Minimal soft and calcified
plaque within the distal innominate artery/proximal right subclavian
artery. No hemodynamically significant innominate or proximal
subclavian artery stenosis.

Right carotid system: CCA and ICA smooth and patent within the neck
without stenosis.

Left carotid system: CCA and ICA smooth and patent within the neck
without stenosis.

Vertebral arteries: Right vertebral artery dominant. Vertebral
artery smooth and patent within the neck without stenosis.

Skeleton: Please refer to separately reported CT of the cervical
spine for cervical spine findings. Elsewhere, no acute bony
abnormality is identified.

Other neck: No neck mass or cervical lymphadenopathy. No significant
neck hematoma.

Upper chest: No consolidation within the imaged lung apices.

Review of the MIP images confirms the above findings

CTA HEAD FINDINGS

Anterior circulation:

The intracranial internal carotid arteries are patent. The M1 middle
cerebral arteries are patent. No M2 proximal branch occlusion or
high-grade proximal stenosis is identified. The anterior cerebral
arteries are patent. No intracranial aneurysm is identified.

Posterior circulation:

The intracranial left vertebral artery is developmentally
diminutive, but patent. The dominant intracranial right vertebral
artery is patent without stenosis. The basilar artery is patent. The
posterior cerebral arteries are patent. A sizable right posterior
communicating artery is present. Suspected small left posterior
communicating artery.

Venous sinuses: Within the limitations of contrast timing, no
convincing thrombus.

Anatomic variants: None significant

Review of the MIP images confirms the above findings
IMPRESSION: CT head:

1. No evidence of acute intracranial abnormality.
2. Paranasal sinus disease as described.

CTA neck:

The common carotid, internal carotid and vertebral arteries are
patent within the neck without stenosis. No evidence of traumatic
vascular injury.

CTA head:

No intracranial large vessel occlusion or proximal high-grade dural
stenosis.
